# Patient Record
Sex: Female | Born: 1975 | Race: White | Hispanic: No | Marital: Married | Smoking: Never smoker
Health system: Southern US, Community
[De-identification: ages and names within clinical notes are randomized; demographics above are authoritative.]

---

## 2013-10-07 LAB — HM PAP SMEAR: HM PAP: NORMAL

## 2014-08-07 LAB — HM MAMMOGRAPHY: HM MAMMO: NORMAL

## 2015-06-08 ENCOUNTER — Ambulatory Visit (INDEPENDENT_AMBULATORY_CARE_PROVIDER_SITE_OTHER): Payer: 59 | Admitting: Osteopathic Medicine

## 2015-06-08 ENCOUNTER — Encounter: Payer: Self-pay | Admitting: Osteopathic Medicine

## 2015-06-08 VITALS — BP 118/79 | HR 71 | Ht 64.75 in | Wt 123.0 lb

## 2015-06-08 DIAGNOSIS — Z13220 Encounter for screening for lipoid disorders: Secondary | ICD-10-CM

## 2015-06-08 DIAGNOSIS — Z79899 Other long term (current) drug therapy: Secondary | ICD-10-CM

## 2015-06-08 DIAGNOSIS — M25531 Pain in right wrist: Secondary | ICD-10-CM

## 2015-06-08 DIAGNOSIS — J302 Other seasonal allergic rhinitis: Secondary | ICD-10-CM | POA: Diagnosis not present

## 2015-06-08 LAB — CBC WITH DIFFERENTIAL/PLATELET
BASOS ABS: 0 10*3/uL (ref 0.0–0.1)
BASOS PCT: 0 % (ref 0–1)
EOS ABS: 0.2 10*3/uL (ref 0.0–0.7)
Eosinophils Relative: 3 % (ref 0–5)
HCT: 41.2 % (ref 36.0–46.0)
HEMOGLOBIN: 14.2 g/dL (ref 12.0–15.0)
Lymphocytes Relative: 24 % (ref 12–46)
Lymphs Abs: 1.8 10*3/uL (ref 0.7–4.0)
MCH: 32.6 pg (ref 26.0–34.0)
MCHC: 34.5 g/dL (ref 30.0–36.0)
MCV: 94.5 fL (ref 78.0–100.0)
MONOS PCT: 7 % (ref 3–12)
MPV: 9.3 fL (ref 8.6–12.4)
Monocytes Absolute: 0.5 10*3/uL (ref 0.1–1.0)
NEUTROS ABS: 4.9 10*3/uL (ref 1.7–7.7)
NEUTROS PCT: 66 % (ref 43–77)
PLATELETS: 323 10*3/uL (ref 150–400)
RBC: 4.36 MIL/uL (ref 3.87–5.11)
RDW: 12.7 % (ref 11.5–15.5)
WBC: 7.4 10*3/uL (ref 4.0–10.5)

## 2015-06-08 LAB — LIPID PANEL
Cholesterol: 186 mg/dL (ref 125–200)
HDL: 120 mg/dL (ref >=46)
LDL CALC: 54 mg/dL (ref <130)
Total CHOL/HDL Ratio: 1.6 Ratio (ref <=5.0)
Triglycerides: 59 mg/dL (ref <150)
VLDL: 12 mg/dL (ref <30)

## 2015-06-08 LAB — COMPLETE METABOLIC PANEL WITH GFR
ALBUMIN: 4.1 g/dL (ref 3.6–5.1)
ALK PHOS: 26 U/L — AB (ref 33–115)
ALT: 19 U/L (ref 6–29)
AST: 23 U/L (ref 10–30)
BILIRUBIN TOTAL: 0.8 mg/dL (ref 0.2–1.2)
BUN: 13 mg/dL (ref 7–25)
CALCIUM: 9.5 mg/dL (ref 8.6–10.2)
CO2: 26 mmol/L (ref 20–31)
CREATININE: 0.76 mg/dL (ref 0.50–1.10)
Chloride: 100 mmol/L (ref 98–110)
GFR, Est Non African American: >89 mL/min (ref >=60)
Glucose, Bld: 83 mg/dL (ref 65–99)
Potassium: 4.1 mmol/L (ref 3.5–5.3)
Sodium: 138 mmol/L (ref 135–146)
TOTAL PROTEIN: 6.8 g/dL (ref 6.1–8.1)

## 2015-06-08 LAB — TSH: TSH: 1.779 u[IU]/mL (ref 0.350–4.500)

## 2015-06-08 MED ORDER — FLUTICASONE PROPIONATE 50 MCG/ACT NA SUSP: 2.0000 | Freq: Every day | NASAL | Status: AC

## 2015-06-08 MED ORDER — IBUPROFEN 400 MG PO TABS: 400.0000 mg | ORAL_TABLET | Freq: Four times a day (QID) | ORAL | Status: AC | PRN

## 2015-06-08 NOTE — Progress Notes (Signed)
HPI: Robin Patrick is a 39 y.o. female who presents to [facility]  today as a new patient for R wrist pain,on onset 2 - 3 weeks. Seem to be worse on medial wrist, has tried Ibuprofen,little relief. She thinks this started due to change in her workspace - now has two monitors, moves R wrist/arm more to use mouse and view monitors. Only ergonomic equipment is pad in front of keyboard, no ergonomic mouse or mousepad. No numbness/tingling or injury to the wrist.   Also reports seasonal allergies seem to be worsening lately, (+)sinus congestion but no pain, no fever/chills, no headache, no vision change.s    History reviewed. No pertinent past medical history. History reviewed. No pertinent past surgical history. Social History  Substance Use Topics  . Smoking status: Not on file  . Smokeless tobacco: Not on file  . Alcohol Use: Not on file   Medications: Current Outpatient Prescriptions  Medication Sig Dispense Refill  . clobetasol cream (TEMOVATE) 0.05 % Apply 1 application topically 2 (two) times daily.    . fluticasone (FLONASE) 50 MCG/ACT nasal spray Place 2 sprays into both nostrils daily. 16 g 6  . ibuprofen (ADVIL,MOTRIN) 400 MG tablet Take 1 tablet (400 mg total) by mouth every 6 (six) hours as needed for mild pain or moderate pain. 60 tablet 1  . ketoconazole (NIZORAL) 2 % cream Apply 1 application topically daily.    . LUTERA 0.1-20 MG-MCG tablet TK 1 T PO QD  4  . metroNIDAZOLE (METROGEL) 1 % gel Apply topically daily.    . minocycline (DYNACIN) 100 MG tablet Take 100 mg by mouth 2 (two) times daily.     No current facility-administered medications for this visit.   Allergies  Allergen Reactions  . Erythromycin Nausea Only     Review of Systems: CONSTITUTIONAL: Neg fever/chills, no unintentional weight changes HEAD/EYES/EARS/NOSE: No headache/vision change/heraing change, (+)seasonal allergies as per HPI CARDIAC: No chest  pain/pressure/palpitations, no orthopnea RESPIRATORY: No cough/shortness of breath/wheeze GASTROINTESTINAL: No nausea/vomiting/abdominal pain/blood in stool/diarrhea/constipation MUSCULOSKELETAL: R wrist pain as per HPI, no fall/injury GENITOURINARY: No incontinence, No abnormal genital bleeding/discharge SKIN: No rash/wounds/concerning lesions HEM/ONC: No easy bruising/bleeding, no abnormal lymph node    Exam:  BP 118/79 mmHg  Pulse 71  Ht 5' 4.75" (1.645 m)  Wt 123 lb (55.792 kg)  BMI 20.62 kg/m2  SpO2 100% Constitutional: VSS, see nurse notes. General Appearance: alert, well-developed, well-nourished, NAD Eyes: Normal lids and conjunctive, non-icteric sclera, PERRLA Ears, Nose, Mouth, Throat: Nasal mucosa pale, mild swollen, clear rhinorrhea Neck: No masses, trachea midline. No thyroid enlargement/tenderness/mass appreciated Respiratory: Normal respiratory effort. No dullness/hyper-resonance to percussion. Breath sounds normal, no wheeze/rhonchi/rales Cardiovascular: S1/S2 normal, no murmur/rub/gallop auscultated. No lower extremity edema Musculoskeletal: Tinel's and Phalen's bilaterally negative, positive mild tender to palpation right medial wrist Between ulna and carpal bones, no overlying skin changes, range of motion normal, muscle strength 5 out of 5  Neurological: No cranial nerve deficit on limited exam. Motor and sensation intact and symmetric Psychiatric: Normal judgment/insight. Normal mood and affect   No results found for this or any previous visit (from the past 24 hour(s)). No results found.   ASSESSMENT/PLAN: Please see individual assessment and plan sections for details and orders.  Summary and/or additional information as follows:  Right wrist pain Likely due to tendinitis: Recommend ice, judicious NSAID use as directed, can try a ergonomic mouse and mousepad to use at work, can try wrist splint as needed. If  she feels no improvement with conservative measures  can schedule with sports medicine for injection and consider physical therapy.  Seasonal allergies: Flonase recommended, prescription called in  Patient instructed to follow-up in clinic for complete physical exam and discussion of blood work results.

## 2015-06-08 NOTE — Patient Instructions (Signed)

## 2015-10-21 ENCOUNTER — Other Ambulatory Visit (HOSPITAL_COMMUNITY)
Admission: RE | Admit: 2015-10-21 | Discharge: 2015-10-21 | Disposition: A | Discharge status: Home / Self Care | Payer: 59 | Source: Ambulatory Visit | Attending: Osteopathic Medicine | Admitting: Osteopathic Medicine

## 2015-10-21 ENCOUNTER — Encounter: Payer: Self-pay | Admitting: Osteopathic Medicine

## 2015-10-21 ENCOUNTER — Ambulatory Visit (INDEPENDENT_AMBULATORY_CARE_PROVIDER_SITE_OTHER): Payer: 59 | Admitting: Osteopathic Medicine

## 2015-10-21 VITALS — BP 115/66 | HR 72 | Ht 64.0 in | Wt 123.0 lb

## 2015-10-21 DIAGNOSIS — Z124 Encounter for screening for malignant neoplasm of cervix: Secondary | ICD-10-CM | POA: Diagnosis not present

## 2015-10-21 DIAGNOSIS — Z0000 Encounter for general adult medical examination without abnormal findings: Secondary | ICD-10-CM | POA: Diagnosis not present

## 2015-10-21 DIAGNOSIS — Z1151 Encounter for screening for human papillomavirus (HPV): Secondary | ICD-10-CM | POA: Diagnosis not present

## 2015-10-21 DIAGNOSIS — Z23 Encounter for immunization: Secondary | ICD-10-CM | POA: Diagnosis not present

## 2015-10-21 DIAGNOSIS — Z1239 Encounter for other screening for malignant neoplasm of breast: Secondary | ICD-10-CM | POA: Insufficient documentation

## 2015-10-21 DIAGNOSIS — Z01411 Encounter for gynecological examination (general) (routine) with abnormal findings: Secondary | ICD-10-CM | POA: Diagnosis present

## 2015-10-21 NOTE — Progress Notes (Signed)
HPI: Robin Patrick is a 39 y.o. female who presents to [facility] today for chief complaint of:  Chief Complaint  Patient presents with  . Annual Exam   Preventive care reviewed as below.   Past medical, social and family history reviewed: No past medical history on file. No past surgical history on file. Social History  Substance Use Topics  . Smoking status: Never Smoker   . Smokeless tobacco: Not on file  . Alcohol Use: Not on file   Family History  Problem Relation Age of Onset  . Cancer Mother     Brease  . Cancer Sister 20    Breast  . Cancer Maternal Grandmother     Breast  . Cancer Maternal Grandfather     Lung    Current Outpatient Prescriptions  Medication Sig Dispense Refill  . clobetasol cream (TEMOVATE) 8.45 % Apply 1 application topically 2 (two) times daily.    . fluticasone (FLONASE) 50 MCG/ACT nasal spray Place 2 sprays into both nostrils daily. 16 g 6  . ibuprofen (ADVIL,MOTRIN) 400 MG tablet Take 1 tablet (400 mg total) by mouth every 6 (six) hours as needed for mild pain or moderate pain. 60 tablet 1  . ketoconazole (NIZORAL) 2 % cream Apply 1 application topically daily.    . LUTERA 0.1-20 MG-MCG tablet TK 1 T PO QD  4  . metroNIDAZOLE (METROGEL) 1 % gel Apply topically daily.    . minocycline (DYNACIN) 100 MG tablet Take 100 mg by mouth 2 (two) times daily.     No current facility-administered medications for this visit.   Allergies  Allergen Reactions  . Erythromycin Nausea Only      Review of Systems: CONSTITUTIONAL:  No fever, no chills, No  unintentional weight changes HEAD/EYES/EARS/NOSE/THROAT: No  headache, no vision change, no hearing change, No  sore throat, No  sinus pressure CARDIAC: No chest pain, No  pressure, No palpitations, No  orthopnea RESPIRATORY: No  cough, No  shortness of breath/wheeze GASTROINTESTINAL: No  nausea, No  vomiting, No  abdominal pain, No  blood in stool, No  diarrhea, No   constipation  MUSCULOSKELETAL: No  myalgia/arthralgia GENITOURINARY: No  incontinence, No  abnormal genital bleeding/discharge SKIN: No  rash/wounds/concerning lesions HEM/ONC: No  easy bruising/bleeding, No  abnormal lymph node ENDOCRINE: No  polyuria/polydipsia/polyphagia, No  heat/cold intolerance  NEUROLOGIC: No  weakness, No  dizziness, No  slurred speech PSYCHIATRIC: No  concerns with depression, No  concerns with anxiety, No sleep problems     Exam:  BP 115/66 mmHg  Pulse 72  Ht 5' 4" (1.626 m)  Wt 123 lb (55.792 kg)  BMI 21.10 kg/m2 Constitutional: VS see above. General Appearance: alert, well-developed, well-nourished, NAD Eyes: Normal lids and conjunctive, non-icteric sclera, PERRLA Ears, Nose, Mouth, Throat: MMM, Normal external inspection ears/nares/mouth/lips/gums,  Neck: No masses, trachea midline. No thyroid enlargement/tenderness/mass appreciated. No lymphadenopathy Respiratory: Normal respiratory effort. no wheeze, no rhonchi, no rales Cardiovascular: S1/S2 normal, no murmur, no rub/gallop auscultated. RRR. No lower extremity edema. Gastrointestinal: Nontender, no masses. No hepatomegaly, no splenomegaly. No hernia appreciated. Bowel sounds normal. Rectal exam deferred.  Musculoskeletal: Gait normal. No clubbing/cyanosis of digits.  Neurological: No cranial nerve deficit on limited exam. Motor and sensation intact and symmetric Skin: warm, dry, intact. No rash/ulcer. No concerning nevi or subq nodules on limited exam.   Psychiatric: Normal judgment/insight. Normal mood and affect. Oriented x3.  GYN: No lesions/ulcers to external genitalia, normal urethra, normal vaginal  mucosa, physiologic discharge, cervix normal without lesions, uterus not enlarged or tender, adnexa no masses and nontender BREAST: No rashes/skin changes, normal fibrous breast tissue, no masses or tenderness, normal nipple without discharge, normal axilla     ASSESSMENT/PLAN: (+)FH Breast cancer,  reviewed diagnostic mammo results 07/2014 recommend screening mammo f/u one year. Flu vax today.  Need to confirm last tetanus vaccine.  Declines STI testing - monogamous with husband.  Not sure if last Pap was HPV cotest, records do not show HPV cotest, will get Pap today.   Annual physical exam  Screening for breast cancer - Plan: MM DIGITAL SCREENING BILATERAL  Screening for cervical cancer - Plan: Cytology - PAP  Need for prophylactic vaccination and inoculation against influenza - Plan: Flu Vaccine QUAD 36+ mos IM   FEMALE PREVENTIVE CARE  ANNUAL SCREENING/COUNSELING Tobacco - Never  Alcohol - social drinker Diet/Exercise - HEALTHY HABITS DISCUSSED TO DECREASE CV RISK - exercise 5 days per week  Sexual Health - Yes with female. 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 INTERESTED IN STI TESTING - no Depression - PQH2 Negative Domestic violence concerns - no HTN SCREENING - SEE VITALS Vaccination status - SEE BELOW  INFECTIOUS DISEASE SCREENING HIV - all adults 15-65 - does not need GC/CT - sexually active - does not need HepC - born 57-1964 - does not need TB - if risk/required by employer - does not need  DISEASE SCREENING Lipid - (Low risk screen M35/F45; High risk screen M25/F35 if HTN, Tob, FH CHD M<55/F<65) - does not need DM2 (45+ or Risk = FH 1st deg DM, Hx GDM, overweight/sedentary, high-risk ethnicity, HTN) - does not need Osteoporosis - age 52+ or one sooner if risk - does not need  CANCER SCREENING Cervical - Pap q3 yr age 31+, Pap + HPV q5y age 29+ - PAP - needs Breast - Mammo age 11+ (C) and biennial age 50-75 (A) - MAMMO - needs. Has had 2 mammograms due to family history. Opted out of genetic counseling. Mom BRCA testing was negative.  Lung - annual low dose CT Chest age 63-75 w/ 30+ PY, current/quit past 15 years - CT - does not need Colon - age 44+ or 38 years of age prior to [place] Dx - GI REFERRAL/ COLOGUARD - does not need  ADULT  VACCINATION Influenza - annual - was given Td booster every 10 years - already has HPV - age <64yo - was not indicated Zoster - age 88+ - was not indicated Pneumonia - age 58+ sooner if risk (DM, smoker, other) - was not indicated  OTHER Fall - exercise and Vit D age 54+ - does not need Consider ASA - age 42-59 - does not need   Return in about 1 year (around 10/20/2016), or sooner if needed, for ANNUAL PHYSICAL.

## 2015-10-22 LAB — CYTOLOGY - PAP

## 2015-11-09 ENCOUNTER — Other Ambulatory Visit: Payer: Self-pay | Admitting: Osteopathic Medicine

## 2015-11-12 ENCOUNTER — Other Ambulatory Visit: Payer: Self-pay

## 2015-11-12 ENCOUNTER — Telehealth: Payer: Self-pay

## 2015-11-12 MED ORDER — CLOTRIMAZOLE-BETAMETHASONE 1-0.05 % EX CREA: 1.0000 "application " | TOPICAL_CREAM | Freq: Two times a day (BID) | CUTANEOUS | Status: AC

## 2015-11-12 MED ORDER — LUTERA 0.1-20 MG-MCG PO TABS: 1.0000 | ORAL_TABLET | Freq: Every day | ORAL | Status: DC

## 2015-11-12 MED ORDER — VALACYCLOVIR HCL 1 G PO TABS: 1000.0000 mg | ORAL_TABLET | Freq: Two times a day (BID) | ORAL | Status: AC | PRN

## 2015-11-12 MED ORDER — CLOBETASOL PROPIONATE 0.05 % EX CREA: 1.0000 "application " | TOPICAL_CREAM | Freq: Two times a day (BID) | CUTANEOUS | Status: DC

## 2015-11-12 NOTE — Telephone Encounter (Signed)
Patient was unable to take at the moment. She will call back when she can talk.

## 2015-11-12 NOTE — Telephone Encounter (Signed)
Patient needs a refill on BCP, clobetasol cream and valtrex 1 gram 1 every 12 hours. She states the pharmacy sent a refill request but it was denied. Historical provider.

## 2015-11-12 NOTE — Telephone Encounter (Signed)
Okay, please call patient, let her know I sent these. 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 We did not have this medication on her list but it was in her record from I believe [facility]. Just need to clarify so we can have accurate chart and problem list, I did not issue any other refills on this medication, she needs to let us know why she is taking it so I can confirm she is on an appropriate dose. Thanks!

## 2015-11-30 ENCOUNTER — Telehealth: Payer: Self-pay | Admitting: Osteopathic Medicine

## 2015-11-30 DIAGNOSIS — M25531 Pain in right wrist: Secondary | ICD-10-CM

## 2015-11-30 NOTE — Telephone Encounter (Signed)
Pt called. She wants be referred to a hand specialist.  Thank you.

## 2015-12-01 NOTE — Telephone Encounter (Signed)
Please see note below. Michalle Rademaker,CMA  

## 2015-12-01 NOTE — Telephone Encounter (Signed)
SPOKE TO PATIENT TO GAVE HER INFORMATION AS NOTED BELOW. SHE WAS SEEN IN AUGUST FOR THE ISSUE AND  SHE STATED THAT HER PROBLEM IS NOT GETTING ANY BETTER AND SHE WANTS TO PROCEED TO THE NEXT STEP WHICH IS WITH A HAND SPECIALIST.PATIENT  Robin Patrick,CMA

## 2015-12-01 NOTE — Telephone Encounter (Signed)
I haven't discussed this issue with her since 05/2015. I would recommend seeing one of our sports med docs prior to referring to an orthopedic surgeon.

## 2015-12-02 NOTE — Telephone Encounter (Signed)
Okay, referred her to hand specialist in [place], please let us know if she doesn't hear back about an appointment with this surgeon.

## 2015-12-02 NOTE — Telephone Encounter (Signed)
Spoke to patient gave her information as noted below. Rhonda Cunningham,CMA  

## 2015-12-16 ENCOUNTER — Encounter: Payer: Self-pay | Admitting: Osteopathic Medicine

## 2015-12-16 ENCOUNTER — Ambulatory Visit (INDEPENDENT_AMBULATORY_CARE_PROVIDER_SITE_OTHER): Payer: 59

## 2015-12-16 ENCOUNTER — Ambulatory Visit (INDEPENDENT_AMBULATORY_CARE_PROVIDER_SITE_OTHER): Payer: 59 | Admitting: Osteopathic Medicine

## 2015-12-16 ENCOUNTER — Other Ambulatory Visit: Payer: Self-pay | Admitting: Osteopathic Medicine

## 2015-12-16 VITALS — BP 123/73 | HR 83 | Ht 64.0 in | Wt 125.0 lb

## 2015-12-16 DIAGNOSIS — M25541 Pain in joints of right hand: Secondary | ICD-10-CM | POA: Diagnosis not present

## 2015-12-16 DIAGNOSIS — M79644 Pain in right finger(s): Secondary | ICD-10-CM | POA: Diagnosis not present

## 2015-12-16 DIAGNOSIS — B354 Tinea corporis: Secondary | ICD-10-CM

## 2015-12-16 DIAGNOSIS — M25531 Pain in right wrist: Secondary | ICD-10-CM

## 2015-12-16 DIAGNOSIS — R21 Rash and other nonspecific skin eruption: Secondary | ICD-10-CM | POA: Diagnosis not present

## 2015-12-16 MED ORDER — CLOTRIMAZOLE 1 % EX OINT: 1.0000 "application " | TOPICAL_OINTMENT | Freq: Two times a day (BID) | CUTANEOUS | Status: DC

## 2015-12-16 NOTE — Patient Instructions (Addendum)
For rash, this looks consistent with possible fungal infection, we'll treat for that, don't use any topical steroid such as hydrocortisone. For itching can use baby powder or can use systemic antihistamine such as Claritin or Benadryl, if those aren't helping please let me know and I can call in something a bit stronger for itching. If rash is getting worse or is still present after 2-3 weeks, please come in for further evaluation or we can also refer to dermatology  We'll get x-rays of wrist and hand today, please make an appointment to see Dr. Denyse Amass on your way out.

## 2015-12-16 NOTE — Progress Notes (Signed)
HPI: Robin Patrick is a 40 y.o. female who presents to [facility] today for chief complaint of:  Chief Complaint  Patient presents with  . Rash    LEFT UNDER ARM  . Wrist Pain    RIGHT    RASH . Location: L Underarm  . Quality: started as a few thin lines, went to itching, started small getting worse  . Duration: 3 weeks . Timing: constant . Context: started after use of new razor . Modifying factors: stopped deodorant and shaving, tea tree oil, coconut oil, hydrocortisone cream which helps but the rash is still there . Assoc signs/symptoms: nondraining, no enlarged LN, no fever/chlils  WRIST PAIN . Location: R wrist/hand . Quality: soreness . Severity: mild - severe depend on day . Duration: >6 mos . Context: worse with movement, worse at work . Modifying factors: tried ergonomics for at work, wrist a bit better but now thumb is bothering her  Other: notes and images needed from [facility] according to the patient.    Past medical, social and family history reviewed: No past medical history on file. No past surgical history on file. Social History  Substance Use Topics  . Smoking status: Never Smoker   . Smokeless tobacco: Not on file  . Alcohol Use: Not on file   Family History  Problem Relation Age of Onset  . Cancer Mother     Brease  . Cancer Sister 67    Breast  . Cancer Maternal Grandmother     Breast  . Cancer Maternal Grandfather     Lung    Current Outpatient Prescriptions  Medication Sig Dispense Refill  . clobetasol cream (TEMOVATE) 0.05 % Apply 1 application topically 2 (two) times daily. 30 g 0  . clotrimazole-betamethasone (LOTRISONE) cream Apply 1 application topically 2 (two) times daily. 45 g 3  . fluticasone (FLONASE) 50 MCG/ACT nasal spray Place 2 sprays into both nostrils daily. 16 g 6  . ibuprofen (ADVIL,MOTRIN) 400 MG tablet Take 1 tablet (400 mg total) by mouth every 6 (six) hours as needed for mild  pain or moderate pain. 60 tablet 1  . ketoconazole (NIZORAL) 2 % cream Apply 1 application topically daily.    Aldean Ast 0.1-20 MG-MCG tablet Take 1 tablet by mouth daily. 1 Package 12  . metroNIDAZOLE (METROGEL) 1 % gel Apply topically daily.    . minocycline (DYNACIN) 100 MG tablet Take 100 mg by mouth 2 (two) times daily.    . valACYclovir (VALTREX) 1000 MG tablet Take 1 tablet (1,000 mg total) by mouth 2 (two) times daily as needed. 30 tablet 0   No current facility-administered medications for this visit.   Allergies  Allergen Reactions  . Erythromycin Nausea Only      Review of Systems: CONSTITUTIONAL:  No  fever, no chills MUSCULOSKELETAL: (+) R wrist/hand myalgia/arthralgia SKIN: (+) rash/wounds/concerning lesions HEM/ONC: No  easy bruising/bleeding, No  abnormal lymph node  Exam:  BP 123/73 mmHg  Pulse 83  Ht  (1.626 m)  Wt 125 lb (56.7 kg)  BMI 21.45 kg/m2 Constitutional: VS see above. General Appearance: alert, well-developed, well-nourished, NAD Musculoskeletal: Gait normal. No clubbing/cyanosis of digits. Normal ROM R wrist and thumb, no bony abnormalities palpable, no crepitus, mild tender R 1st MCP, neg Tinel's Skin: warm, dry, intact. (+) rash, multiple linear areas of erythema in L axilla, nondraining, nontender, no enlarged axillary LN, otherwise No rash/ulcer. No concerning nevi or subq nodules on limited exam.  ASSESSMENT/PLAN: Rash c/w possible tinea, treat w/ antifungal, avoid shaving and deodorant for now to avoid irritation. If signs of bacterial infection such as pain/drainage or worsening rash RTC for further eval.  For wrist pain/thumb pain, pt opting to see one of our sports med docs instead of ortho, will get XR today, still I suspect likely tendinitis but she may certainly benefit from injection/brace/PT. Pt to make appt to come back and see Dr Denyse Amass.    Right wrist pain - Plan: DG Wrist Complete Right, DG Hand Complete Right  Thumb pain,  right - Plan: DG Hand Complete Right  Rash and nonspecific skin eruption - Plan: Clotrimazole 1 % OINT  Tinea corporis - Plan: Clotrimazole 1 % OINT   Return if symptoms worsen or fail to improve.

## 2015-12-17 ENCOUNTER — Encounter: Payer: Self-pay | Admitting: Family Medicine

## 2015-12-17 ENCOUNTER — Ambulatory Visit (INDEPENDENT_AMBULATORY_CARE_PROVIDER_SITE_OTHER): Payer: 59 | Admitting: Family Medicine

## 2015-12-17 VITALS — BP 130/76 | HR 69 | Ht 64.0 in | Wt 126.0 lb

## 2015-12-17 DIAGNOSIS — M778 Other enthesopathies, not elsewhere classified: Secondary | ICD-10-CM | POA: Diagnosis not present

## 2015-12-17 MED ORDER — DICLOFENAC SODIUM 2 % TD SOLN: 2.0000 | Freq: Two times a day (BID) | TRANSDERMAL | Status: AC

## 2015-12-17 NOTE — Progress Notes (Signed)
   Subjective:    I'm seeing this patient as a consultation for:  Dr. Lyn Hollingshead  CC: Right wrist and thumb pain  HPI: Patient has an 8 month history of ulnar sided wrist pain. She has tried bracing and different mouse at work which has helped a bit. She's tried ibuprofen which hasn't. We'll send a note mouth are those become painful. She notes radial sided MCP thumb pain and swelling that has resolved that. She denies any radiating pain weakness or numbness. She denies any specific injury locking or catching at rest.  Past medical history, Surgical history, Family history not pertinant except as noted below, Social history, Allergies, and medications have been entered into the medical record, reviewed, and no changes needed.   Review of Systems: No headache, visual changes, nausea, vomiting, diarrhea, constipation, dizziness, abdominal pain, skin rash, fevers, chills, night sweats, weight loss, swollen lymph nodes, body aches, joint swelling, muscle aches, chest pain, shortness of breath, mood changes, visual or auditory hallucinations.   Objective:    Filed Vitals:   12/17/15 0839  BP: 130/76  Pulse: 69   General: Well Developed, well nourished, and in no acute distress.  Neuro/Psych: Alert and oriented x3, extra-ocular muscles intact, able to move all 4 extremities, sensation grossly intact. Skin: Warm and dry, no rashes noted.  Respiratory: Not using accessory muscles, speaking in full sentences, trachea midline.  Cardiovascular: Pulses palpable, no extremity edema. Abdomen: Does not appear distended. MSK: Right wrist is normal-appearing with no swelling or ecchymosis. Normal motion. Pain with resisted extension and ulnar deviation. Hand is normal appearing and nontender with normal motion. No triggering.  Pulses capillary refill sensation and strength intact.  No results found for this or any previous visit (from the past 24 hour(s)). Dg Wrist Complete Right  12/16/2015  CLINICAL  DATA:  Right wrist and hand pain. EXAM: RIGHT WRIST - COMPLETE 3+ VIEW COMPARISON:  No prior. FINDINGS: No acute bony or joint abnormality identified. Faint noted along the distal radius is most likely residual epiphyseal plate. No evidence of displaced fracture or dislocation. IMPRESSION: No acute abnormality. Electronically Signed   By: Maisie Fus  Register   On: 12/16/2015 09:24   Dg Hand Complete Right  12/16/2015  CLINICAL DATA:  Right wrist and hand pain.  Initial evaluation. EXAM: RIGHT HAND - COMPLETE 3+ VIEW COMPARISON:  No prior. FINDINGS: No acute bony or joint abnormality identified. No evidence of fracture dislocation. IMPRESSION: No acute abnormality. Electronically Signed   By: Maisie Fus  Register   On: 12/16/2015 09:25    Impression and Recommendations:   This case required medical decision making of moderate complexity.

## 2015-12-17 NOTE — Assessment & Plan Note (Signed)
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 Plan for diclofenac solution and referral to hand therapy. Recheck in one month. Thumb pain is likely overuse related. Same treatment.

## 2015-12-17 NOTE — Patient Instructions (Signed)
Thank you for coming in today. Attend hand therapy.  Return in 1 month.  Return sooner if needed.  Apply Pennsaid 2% solution 4x daily.   Tendinitis Tendinitis is swelling and inflammation of the tendons. Tendons are band-like tissues that connect muscle to bone. Tendinitis commonly occurs in the:   Shoulders (rotator cuff).  Heels (Achilles tendon).  Elbows (triceps tendon). CAUSES Tendinitis is usually caused by overusing the tendon, muscles, and joints involved. When the tissue surrounding a tendon (synovium) becomes inflamed, it is called tenosynovitis. Tendinitis commonly develops in people whose jobs require repetitive motions. SYMPTOMS  Pain.  Tenderness.  Mild swelling. DIAGNOSIS Tendinitis is usually diagnosed by physical exam. Your health care provider may also order X-rays or other imaging tests. TREATMENT Your health care provider may recommend certain medicines or exercises for your treatment. HOME CARE INSTRUCTIONS   Use a sling or splint for as long as directed by your health care provider until the pain decreases.  Put ice on the injured area.  Put ice in a plastic bag.  Place a towel between your skin and the bag.  Leave the ice on for 15-20 minutes, 3-4 times a day, or as directed by your health care provider.  Avoid using the limb while the tendon is painful. Perform gentle range of motion exercises only as directed by your health care provider. Stop exercises if pain or discomfort increase, unless directed otherwise by your health care provider.  Only take over-the-counter or prescription medicines for pain, discomfort, or fever as directed by your health care provider. SEEK MEDICAL CARE IF:   Your pain and swelling increase.  You develop new, unexplained symptoms, especially increased numbness in the hands. MAKE SURE YOU:   Understand these instructions.  Will watch your condition.  Will get help right away if you are not doing well or get  worse.   This information is not intended to replace advice given to you by your health care provider. Make sure you discuss any questions you have with your health care provider.   Document Released: 10/07/2000 Document Revised: 10/31/2014 Document Reviewed: 12/27/2010 Elsevier Interactive Patient Education [employer]

## 2015-12-25 ENCOUNTER — Encounter: Payer: Self-pay | Admitting: Osteopathic Medicine

## 2015-12-25 MED ORDER — BUTENAFINE HCL 1 % EX CREA: TOPICAL_CREAM | CUTANEOUS | Status: DC

## 2016-11-01 ENCOUNTER — Encounter: Payer: Self-pay | Admitting: Osteopathic Medicine

## 2016-11-01 ENCOUNTER — Ambulatory Visit (INDEPENDENT_AMBULATORY_CARE_PROVIDER_SITE_OTHER): Payer: 59 | Admitting: Osteopathic Medicine

## 2016-11-01 VITALS — BP 126/84 | HR 84 | Ht 64.0 in | Wt 122.0 lb

## 2016-11-01 DIAGNOSIS — M25561 Pain in right knee: Secondary | ICD-10-CM

## 2016-11-01 DIAGNOSIS — Z0000 Encounter for general adult medical examination without abnormal findings: Secondary | ICD-10-CM

## 2016-11-01 DIAGNOSIS — Z0001 Encounter for general adult medical examination with abnormal findings: Secondary | ICD-10-CM

## 2016-11-01 LAB — LIPID PANEL
CHOLESTEROL: 178 mg/dL (ref <200)
HDL: 123 mg/dL (ref >50)
LDL CALC: 41 mg/dL (ref <100)
TRIGLYCERIDES: 72 mg/dL (ref <150)
Total CHOL/HDL Ratio: 1.4 Ratio (ref <5.0)
VLDL: 14 mg/dL (ref <30)

## 2016-11-01 LAB — COMPLETE METABOLIC PANEL WITH GFR
ALT: 21 U/L (ref 6–29)
AST: 24 U/L (ref 10–30)
Albumin: 3.8 g/dL (ref 3.6–5.1)
Alkaline Phosphatase: 30 U/L — ABNORMAL LOW (ref 33–115)
BILIRUBIN TOTAL: 0.8 mg/dL (ref 0.2–1.2)
BUN: 16 mg/dL (ref 7–25)
CALCIUM: 8.5 mg/dL — AB (ref 8.6–10.2)
CHLORIDE: 103 mmol/L (ref 98–110)
CO2: 25 mmol/L (ref 20–31)
CREATININE: 0.85 mg/dL (ref 0.50–1.10)
GFR, EST NON AFRICAN AMERICAN: 87 mL/min (ref >=60)
GFR, Est African American: >89 mL/min (ref >=60)
Glucose, Bld: 90 mg/dL (ref 65–99)
Potassium: 3.8 mmol/L (ref 3.5–5.3)
Sodium: 137 mmol/L (ref 135–146)
Total Protein: 6.5 g/dL (ref 6.1–8.1)

## 2016-11-01 MED ORDER — CLOBETASOL PROPIONATE 0.05 % EX SOLN: 1.0000 "application " | Freq: Two times a day (BID) | CUTANEOUS | 12 refills | Status: AC

## 2016-11-01 MED ORDER — LUTERA 0.1-20 MG-MCG PO TABS: 1.0000 | ORAL_TABLET | Freq: Every day | ORAL | 12 refills | Status: AC

## 2016-11-01 MED ORDER — KETOCONAZOLE 2 % EX SHAM: 1.0000 "application " | MEDICATED_SHAMPOO | CUTANEOUS | 0 refills | Status: AC

## 2016-11-01 MED ORDER — MINOCYCLINE HCL 100 MG PO TABS: 100.0000 mg | ORAL_TABLET | Freq: Two times a day (BID) | ORAL | 3 refills | Status: DC

## 2016-11-01 NOTE — Progress Notes (Signed)
HPI: Robin Patrick is a 41 y.o. female  who presents to [facility] today, 11/01/16,  for chief complaint of:  Chief Complaint  Patient presents with  . Annual Exam    Annual physical exam: See below for review of preventive care.  New/acute problem: Right knee pain, patient states injury sustained about a week ago on ski trip due to fall, has been getting steadily better but feels like she is having difficulty straightening her knee, no significant pain with ambulation, no feeling of joint laxity, no subsequent fall.    Past medical, surgical, social and family history reviewed: Patient Active Problem List   Diagnosis Date Noted  . Right wrist tendonitis 12/17/2015  . Screening for breast cancer 10/21/2015   No past surgical history on file. Social History  Substance Use Topics  . Smoking status: Never Smoker  . Smokeless tobacco: Not on file  . Alcohol use Not on file   Family History  Problem Relation Age of Onset  . Cancer Mother     Brease  . Cancer Sister 33    Breast  . Cancer Maternal Grandmother     Breast  . Cancer Maternal Grandfather     Lung     Current medication list and allergy/intolerance information reviewed:   Current Outpatient Prescriptions  Medication Sig Dispense Refill  . Butenafine HCl 1 % cream Apply topically 2 (two) times daily 24 g 0  . clobetasol cream (TEMOVATE) 0.05 % Apply 1 application topically 2 (two) times daily. 30 g 0  . clotrimazole-betamethasone (LOTRISONE) cream Apply 1 application topically 2 (two) times daily. 45 g 3  . Diclofenac Sodium 2 % SOLN Place 2 sprays onto the skin 2 (two) times daily. 1 Bottle 11  . fluticasone (FLONASE) 50 MCG/ACT nasal spray Place 2 sprays into both nostrils daily. 16 g 6  . ibuprofen (ADVIL,MOTRIN) 400 MG tablet Take 1 tablet (400 mg total) by mouth every 6 (six) hours as needed for mild pain or moderate pain. 60 tablet 1  . ketoconazole (NIZORAL) 2 % cream Apply  1 application topically daily. shampoo    . LUTERA 0.1-20 MG-MCG tablet Take 1 tablet by mouth daily. 1 Package 12  . metroNIDAZOLE (METROGEL) 1 % gel Apply topically daily.    . minocycline (DYNACIN) 100 MG tablet Take 100 mg by mouth 2 (two) times daily.    . valACYclovir (VALTREX) 1000 MG tablet Take 1 tablet (1,000 mg total) by mouth 2 (two) times daily as needed. 30 tablet 0   No current facility-administered medications for this visit.    Allergies  Allergen Reactions  . Erythromycin Nausea Only      Review of Systems:  Constitutional:  No  fever, no chills, No recent illness, No unintentional weight changes. No significant fatigue.   HEENT: No  headache, no vision change, no hearing change, No sore throat, No  sinus pressure  Cardiac: No  chest pain, No  pressure, No palpitations,  Respiratory:  No  shortness of breath. No  Cough  Gastrointestinal: No  abdominal pain, No  nausea, No  vomiting,   Musculoskeletal: +new myalgia/arthralgia  Skin: No  Rash  Hem/Onc: No  easy bruising/bleeding, No  abnormal lymph node  Neurologic: No  weakness, No  dizziness,   Psychiatric: No  concerns with depression, No  concerns with anxiety, No sleep problems, No mood problems  Exam:  BP 126/84   Pulse 84   Ht 5\' 4"  (1.626 m)  Wt 122 lb (55.3 kg)   BMI 20.94 kg/m   Constitutional: VS see above. General Appearance: alert, well-developed, well-nourished, NAD  Eyes: Normal lids and conjunctive, non-icteric sclera  Ears, Nose, Mouth, Throat: MMM, Normal external inspection ears/nares/mouth/lips/gums. TM normal bilaterally. Pharynx/tonsils no erythema, no exudate. Nasal mucosa normal.   Neck: No masses, trachea midline. No thyroid enlargement. No tenderness/mass appreciated. No lymphadenopathy  Respiratory: Normal respiratory effort. no wheeze, no rhonchi, no rales  Cardiovascular: S1/S2 normal, no murmur, no rub/gallop auscultated. RRR. No lower extremity edema.    Gastrointestinal: Nontender, no masses. No hepatomegaly, no splenomegaly. No hernia appreciated. Bowel sounds normal. Rectal exam deferred.   Musculoskeletal: Gait normal. No clubbing/cyanosis of digits. Right knee: Negative anterior/posterior drawer, negative varus/by this test, negative McMurray's, some laxity to patella evident on right as compared to left   Neurological: Normal balance/coordination. No tremor. No cranial nerve deficit on limited exam. Motor and sensation intact and symmetric. Cerebellar reflexes intact.   Skin: warm, dry, intact. No rash/ulcer. No concerning nevi or subq nodules on limited exam.    Psychiatric: Normal judgment/insight. Normal mood and affect. Oriented x3.    Results for orders placed or performed in visit on 11/01/16 (from the past 72 hour(s))  COMPLETE METABOLIC PANEL WITH GFR     Status: Abnormal   Collection Time: 11/01/16  9:23 AM  Result Value Ref Range   Sodium 137 135 - 146 mmol/L   Potassium 3.8 3.5 - 5.3 mmol/L   Chloride 103 98 - 110 mmol/L   CO2 25 20 - 31 mmol/L   Glucose, Bld 90 65 - 99 mg/dL   BUN 16 7 - 25 mg/dL   Creat 1.61 0.96 - 0.45 mg/dL   Total Bilirubin 0.8 0.2 - 1.2 mg/dL   Alkaline Phosphatase 30 (L) 33 - 115 U/L   AST 24 10 - 30 U/L   ALT 21 6 - 29 U/L   Total Protein 6.5 6.1 - 8.1 g/dL   Albumin 3.8 3.6 - 5.1 g/dL   Calcium 8.5 (L) 8.6 - 10.2 mg/dL   GFR, Est African American >89 >=60 mL/min   GFR, Est Non African American 87 >=60 mL/min  Lipid panel     Status: None   Collection Time: 11/01/16  9:23 AM  Result Value Ref Range   Cholesterol 178 <200 mg/dL   Triglycerides 72 <409 mg/dL   HDL 811 >91 mg/dL    Comment: Result repeated and verified. Result confirmed by automatic dilution.    Total CHOL/HDL Ratio 1.4 <5.0 Ratio   VLDL 14 <30 mg/dL   LDL Cholesterol 41 <478 mg/dL      ASSESSMENT/PLAN:   Annual physical exam - Plan: COMPLETE METABOLIC PANEL WITH GFR, Lipid panel  Acute pain of right knee -  Some patellar subluxation, rehabilitation exercises given, advised schedule follow-up with sports medicine if pain is not resolved by end of the week      FEMALE PREVENTIVE CARE Updated 11/01/16   ANNUAL SCREENING/COUNSELING  Diet/Exercise - HEALTHY HABITS DISCUSSED TO DECREASE CV RISK History  Smoking Status  . Never Smoker  Smokeless Tobacco  . Not on file   History  Alcohol use Not on file   No flowsheet data found.  Domestic violence concerns - no  HTN SCREENING - SEE VITALS  SEXUAL HEALTH  Sexually active in the past year - Yes with female.  Need/want STI testing today? - no  Concerns about libido or pain with sex? - no  Plans for  pregnancy? - on birth control   INFECTIOUS DISEASE SCREENING  HIV - does not need - Declined  GC/CT - does not need - declined  HepC - DOB [birth year] - does not need  TB - does not need  DISEASE SCREENING  Lipid - needs  DM2 - needs  Osteoporosis - women age 35+ - does not need   CANCER SCREENING  Cervical - does not need  Breast - plan to start at age 40, positive family history sister but sister tested negative for genetic mutation  Lung - does not need  Colon - does not need  ADULT VACCINATION  Influenza - annual vaccine recommended  Td - booster every 10 years   Zoster - option at 6150, yes at 60+   PCV13 - was not indicated  PPSV23 - was not indicated Immunization History  Administered Date(s) Administered  . Influenza,inj,Quad PF,36+ Mos 10/21/2015    OTHER  Fall - exercise and Vit D age 35+ - does not need  Consider ASA - age 40-59 - does not need    Visit summary with medication list and pertinent instructions was printed for patient to review. All questions at time of visit were answered - patient instructed to contact office with any additional concerns. ER/RTC precautions were reviewed with the patient. Follow-up plan: Return in about 1 year (around 11/01/2017) for [facility], sooner if  needed for sports med to evaluate the knee.

## 2016-11-07 ENCOUNTER — Encounter: Payer: Self-pay | Admitting: Family Medicine

## 2016-11-07 ENCOUNTER — Ambulatory Visit (INDEPENDENT_AMBULATORY_CARE_PROVIDER_SITE_OTHER): Payer: 59 | Admitting: Family Medicine

## 2016-11-07 DIAGNOSIS — M25561 Pain in right knee: Secondary | ICD-10-CM

## 2016-11-07 NOTE — Progress Notes (Signed)
   Subjective:    I'm seeing this patient as a consultation for:  Sunnie NielsenNatalie Alexander, DO   CC: Right knee injury  HPI: Patient injured her right knee skiing 2 weeks ago. She fell standing and twisted her knee. She does not remember a pop and denies any knee swelling. She has been resting her knee and using topical Pennsaid. She notes this helps a little. She notes that she cannot fully extend her knee nor fully flex it. She feels popping occasionally with knee extension. She has some pain along the medial aspect of the knee as well. She denies any fevers or chills nausea vomiting or diarrhea. She typically exercises daily doing plyometrics squats and lunges and running. She notes that since her injury she has been unable to exercise. This is very bothersome and interferes with her quality of life.   Past medical history, Surgical history, Family history not pertinant except as noted below, Social history, Allergies, and medications have been entered into the medical record, reviewed, and no changes needed.   Review of Systems: No headache, visual changes, nausea, vomiting, diarrhea, constipation, dizziness, abdominal pain, skin rash, fevers, chills, night sweats, weight loss, swollen lymph nodes, body aches, joint swelling, muscle aches, chest pain, shortness of breath, mood changes, visual or auditory hallucinations.   Objective:    Vitals:   11/07/16 0858  BP: 123/71  Pulse: 72   General: Well Developed, well nourished, and in no acute distress.  Neuro/Psych: Alert and oriented x3, extra-ocular muscles intact, able to move all 4 extremities, sensation grossly intact. Skin: Warm and dry, no rashes noted.  Respiratory: Not using accessory muscles, speaking in full sentences, trachea midline.  Cardiovascular: Pulses palpable, no extremity edema. Abdomen: Does not appear distended. MSK: Right Knee is unremarkable with no significant effusion. Mild quadricep atrophy right compared to  left. Range of motion: 5-120 compared to 0-120 left. Tender to palpation medial joint line. No laxity or tenderness with anterior posterior drawer testing. Laxity but pain present with MCL stress test. No laxity or pain with LCL stress. Mildly positive medial McMurray's test negative lateral. Intact flexion and extension strength. Normal gait.   No results found.  Impression and Recommendations:    Assessment and Plan: 41 y.o. female with Right knee pain concerning for injury of MCL or medial meniscus. We discussed options. Plan for trial of home therapy. Additionally continue topical Pennsaid. If not better will proceed with MRI in the near future.   discussed warning signs or symptoms. Please see discharge instructions. Patient expresses understanding.

## 2016-11-07 NOTE — Patient Instructions (Signed)
Thank you for coming in today. Work on knee exercises.  Cycling Eccentric Quad knee extension.  High seat position single leg squats.   Let me know if not better in 1-2 weeks and we will do a MRI.     Meniscus Tear Introduction A meniscus tear is a knee injury in which a piece of the meniscus is torn. The meniscus is a thick, rubbery, wedge-shaped cartilage in the knee. Two menisci are located in each knee. They sit between the upper bone (femur) and lower bone (tibia) that make up the knee joint. Each meniscus acts as a shock absorber for the knee. A torn meniscus is one of the most common types of knee injuries. This injury can range from mild to severe. Surgery may be needed for a severe tear. What are the causes? This injury may be caused by any squatting, twisting, or pivoting movement. Sports-related injuries are the most common cause. These often occur from:  Running and stopping suddenly.  Changing direction.  Being tackled or knocked off your feet. As people get older, their meniscus gets thinner and weaker. In these people, tears can happen more easily, such as from climbing stairs. What increases the risk? This injury is more likely to happen to:  People who play contact sports.  Males.  People who are 51?40 years of age. What are the signs or symptoms? Symptoms of this injury include:  Knee pain, especially at the side of the knee joint. You may feel pain when the injury occurs, or you may only hear a pop and feel pain later.  A feeling that your knee is clicking, catching, locking, or giving way.  Not being able to fully bend or extend your knee.  Bruising or swelling in your knee. How is this diagnosed? This injury may be diagnosed based on your symptoms and a physical exam. The physical exam may include:  Moving your knee in different ways.  Feeling for tenderness.  Listening for a clicking sound.  Checking if your knee locks or catches. You may also  have tests, such as:  X-rays.  MRI.  A procedure to look inside your knee with a narrow surgical telescope (arthroscopy). You may be referred to a knee specialist (orthopedic surgeon). How is this treated? Treatment for this injury depends on the severity of the tear. Treatment for a mild tear may include:  Rest.  Medicine to reduce pain and swelling. This is usually a nonsteroidal anti-inflammatory drug (NSAID).  A knee brace or an elastic sleeve or wrap.  Using crutches or a walker to keep weight off your knee and to help you walk.  Exercises to strengthen your knee (physical therapy). You may need surgery if you have a severe tear or if other treatments are not working. Follow these instructions at home: Managing pain and swelling  Take over-the-counter and prescription medicines only as told by your health care provider.  If directed, apply ice to the injured area:  Put ice in a plastic bag.  Place a towel between your skin and the bag.  Leave the ice on for 20 minutes, 2-3 times per day.  Raise (elevate) the injured area above the level of your heart while you are sitting or lying down. Activity  Do not use the injured limb to support your body weight until your health care provider says that you can. Use crutches or a walker as told by your health care provider.  Return to your normal activities as told by  your health care provider. Ask your health care provider what activities are safe for you.  Perform range-of-motion exercises only as told by your health care provider.  Begin doing exercises to strengthen your knee and leg muscles only as told by your health care provider. After you recover, your health care provider may recommend these exercises to help prevent another injury. General instructions  Use a knee brace or elastic wrap as told by your health care provider.  Keep all follow-up visits as told by your health care provider. This is important. Contact  a health care provider if:  You have a fever.  Your knee becomes red, tender, or swollen.  Your pain medicine is not helping.  Your symptoms get worse or do not improve after 2 weeks of home care. This information is not intended to replace advice given to you by your health care provider. Make sure you discuss any questions you have with your health care provider. Document Released: 12/31/2002 Document Revised: 03/17/2016 Document Reviewed: 02/02/2015  2017 [employer]vier

## 2016-11-11 ENCOUNTER — Telehealth: Payer: Self-pay | Admitting: *Deleted

## 2016-11-11 DIAGNOSIS — L709 Acne, unspecified: Secondary | ICD-10-CM

## 2016-11-11 DIAGNOSIS — L719 Rosacea, unspecified: Secondary | ICD-10-CM

## 2016-11-11 NOTE — Telephone Encounter (Signed)
Trying to initiate a PA for minocycline.  I may be overlooking it but didn't see a diagnosis for this med. This if for acne I assume?

## 2016-11-13 DIAGNOSIS — L709 Acne, unspecified: Secondary | ICD-10-CM | POA: Insufficient documentation

## 2016-11-14 ENCOUNTER — Encounter: Payer: Self-pay | Admitting: Family Medicine

## 2016-11-14 DIAGNOSIS — M25561 Pain in right knee: Secondary | ICD-10-CM

## 2016-11-17 NOTE — Telephone Encounter (Signed)
Yes, for acne.

## 2016-11-21 ENCOUNTER — Ambulatory Visit (INDEPENDENT_AMBULATORY_CARE_PROVIDER_SITE_OTHER): Payer: 59

## 2016-11-21 DIAGNOSIS — M25561 Pain in right knee: Secondary | ICD-10-CM

## 2016-11-22 ENCOUNTER — Encounter: Payer: Self-pay | Admitting: Family Medicine

## 2016-11-28 ENCOUNTER — Ambulatory Visit (INDEPENDENT_AMBULATORY_CARE_PROVIDER_SITE_OTHER): Payer: 59 | Admitting: Family Medicine

## 2016-11-28 VITALS — BP 135/72 | HR 76 | Wt 127.1 lb

## 2016-11-28 DIAGNOSIS — M25561 Pain in right knee: Secondary | ICD-10-CM

## 2016-11-28 NOTE — Patient Instructions (Signed)
Thank you for coming in today. You should hear from me soon about the repeat read on the MRI.  Attend PT.  Recheck in about 4 week or sooner if needed.    Semimembranosus Tendinitis Rehab Ask your health care provider which exercises are safe for you. Do exercises exactly as told by your health care provider and adjust them as directed. It is normal to feel mild stretching, pulling, tightness, or discomfort as you do these exercises, but you should stop right away if you feel sudden pain or your pain gets worse.Do not begin these exercises until told by your health care provider. Stretching and range of motion exercises These exercises warm up your muscles and joints and improve the movement and flexibility of your thigh. These exercises also help to relieve pain, numbness, and tingling. Exercise A: Hamstring stretch, supine 1. Lie on your back. Loop a belt or towel across the ball of your left / right foot The ball of your foot is on the walking surface, right under your toes. 2. Straighten your left / right knee and slowly pull on the belt to raise your leg. Stop when you feel a gentle stretch behind your left / right knee or thigh.  Do not allow the knee to bend.  Keep your other leg flat on the floor. 3. Hold this position for __________ seconds. Repeat __________ times. Complete this exercise __________ times a day. Strengthening exercises These exercises build strength and endurance in your thigh. Endurance is the ability to use your muscles for a long time, even after they get tired. Exercise B: Straight leg raises (hip extensors) 1. Lie on your belly on a bed or a firm surface with a pillow under your hips. 2. Bend your left / right knee so your foot is straight up in the air. 3. Squeeze your buttock muscles and lift your left / right thigh off the bed. Do not let your back arch. 4. Hold this position for __________seconds. 5. Slowly return to the starting position. Let your  muscles relax completely before you do another repetition. Repeat __________ times. Complete this exercise __________ times a day. Exercise C: Bridge (hip extensors) 1. Lie on your back on a firm surface with your knees bent and your feet flat on the floor. 2. Tighten your buttocks muscles and lift your bottom off the floor until your trunk is level with your thighs.  You should feel the muscles working in your buttocks and the back of your thighs. If you do not feel these muscles, slide your feet 1-2 inches (2.5-5 cm) farther away from your buttocks.  Do not arch your back. 3. Hold this position for __________ seconds. 4. Slowly lower your hips to the starting position. 5. Let your buttocks muscles relax completely between repetitions. If this exercise is too easy, try doing it with your arms crossed over your chest. Repeat __________ times. Complete this exercise __________ times a day. Exercise D: Hamstring eccentric, prone 1. Lie on your belly on a bed or on the floor. 2. Start with your legs straight. Cross your legs at the ankles with your left / right leg on top. 3. Using your bottom leg to do the work, bend both knees. 4. Using just your left / right leg alone, slowly lower your leg back down toward the bed. Add a __________ weight as told by your health care provider. 5. Let your muscles relax completely between repetitions. Repeat __________ times. Complete this exercise __________ times a  day. Exercise E: Squats 1. Stand in front of a table, with your feet and knees pointing straight ahead. You may rest your hands on the table for balance but not for support. 2. Slowly bend your knees and lower your hips like you are going to sit in a chair. Keep your thighs straight or pointed slightly outward.  Keep your weight over your heels, not over your toes.  Keep your lower legs upright so they are parallel with the table legs.  Do not let your hips go lower than your knees. Stop when  your knees are bent to the shape of an upside-down letter "L" (90 degree angle).  Do not bend lower than told by your health care provider.  If your knee pain increases, do not bend as low. 3. Hold the squat position __________ seconds. 4. Slowly push with your legs to return to standing. Do not use your hands to pull yourself to standing. Repeat __________ times. Complete this exercise __________ times a day. This information is not intended to replace advice given to you by your health care provider. Make sure you discuss any questions you have with your health care provider. Document Released: 10/10/2005 Document Revised: 06/16/2016 Document Reviewed: 07/14/2015 Elsevier Interactive Patient Education  2017 [employer].

## 2016-11-28 NOTE — Progress Notes (Signed)
Robin Patrick is a 41 y.o. female who presents to [facility] today for follow up knee pain.   Robin Patrick was last seen on Jan 15th for right knee pain. The pain occurred during a ski injury. She was though to have a medial meniscus or MCL injury. She had a MRI on Jan 29th. On return today she notes that her pain is slightly improved but still present. She notes she has pain at the medial aspect of the posterior knee with knee extension. She denies locking or catching however.   No past medical history on file. No past surgical history on file. Social History  Substance Use Topics  . Smoking status: Never Smoker  . Smokeless tobacco: Not on file  . Alcohol use Not on file     ROS:  As above   Medications: Current Outpatient Prescriptions  Medication Sig Dispense Refill  . clobetasol (TEMOVATE) 0.05 % external solution Apply 1 application topically 2 (two) times daily. To scalp as needed 50 mL 12  . clotrimazole-betamethasone (LOTRISONE) cream Apply 1 application topically 2 (two) times daily. 45 g 3  . Diclofenac Sodium 2 % SOLN Place 2 sprays onto the skin 2 (two) times daily. 1 Bottle 11  . fluticasone (FLONASE) 50 MCG/ACT nasal spray Place 2 sprays into both nostrils daily. 16 g 6  . ibuprofen (ADVIL,MOTRIN) 400 MG tablet Take 1 tablet (400 mg total) by mouth every 6 (six) hours as needed for mild pain or moderate pain. 60 tablet 1  . ketoconazole (NIZORAL) 2 % shampoo Apply 1 application topically 2 (two) times a week. 120 mL 0  . LUTERA 0.1-20 MG-MCG tablet Take 1 tablet by mouth daily. 1 Package 12  . metroNIDAZOLE (METROGEL) 1 % gel Apply topically daily.    . minocycline (DYNACIN) 100 MG tablet Take 1 tablet (100 mg total) by mouth 2 (two) times daily. As needed for rosacea 30 tablet 3  . valACYclovir (VALTREX) 1000 MG tablet Take 1 tablet (1,000 mg total) by mouth 2 (two) times daily as needed. 30 tablet 0   No current  facility-administered medications for this visit.    Allergies  Allergen Reactions  . Erythromycin Nausea Only     Exam:  BP 135/72   Pulse 76   Wt 127 lb 1.9 oz (57.7 kg)   LMP 11/07/2016   SpO2 100%   BMI 21.82 kg/m  General: Well Developed, well nourished, and in no acute distress.  Neuro/Psych: Alert and oriented x3, extra-ocular muscles intact, able to move all 4 extremities, sensation grossly intact. Skin: Warm and dry, no rashes noted.  Respiratory: Not using accessory muscles, speaking in full sentences, trachea midline.  Cardiovascular: Pulses palpable, no extremity edema. Abdomen: Does not appear distended. MSK: Right knee is normal-appearing. Range of motion full extension over with pain beyond about 3 normal flexion. Tender to palpation posterior medial knee especially at the semimembranosus and semitendinosus and at the medial posterior joint line. Mildly positive medial McMurray's test negative lateral. No pain or significant laxity with MCL or LCL stress. Negative tear posterior drawer test. Intact extension and flexion strength.   Study Result   CLINICAL DATA:  Right knee pain since a fall while skiing 1 month ago.  EXAM: MRI OF THE RIGHT KNEE WITHOUT CONTRAST  TECHNIQUE: Multiplanar, multisequence MR imaging of the knee was performed. No intravenous contrast was administered.  COMPARISON:  None.  FINDINGS: MENISCI  Medial meniscus:  Intact.  Lateral meniscus:  Intact.  LIGAMENTS  Cruciates:  Intact.  Collaterals:  Intact.  CARTILAGE  Patellofemoral:  Negative.  Medial:  Negative.  Lateral:  Negative.  Joint:  Trace joint effusion.  Popliteal Fossa:  No Baker's cyst.  Extensor Mechanism:  Intact.  Bones:  Normal marrow signal throughout.  Other: None.  IMPRESSION: Negative for meniscal or ligament tear.  Negative right knee MRI.   Electronically Signed   By: Drusilla Kanner M.D.   On: 11/21/2016  12:30       No results found for this or any previous visit (from the past 48 hour(s)). No results found.    Assessment and Plan: 41 y.o. female with right knee pain. At this time etiology is somewhat unclear. I was expecting a meniscus injury. I think it is possible that she does have a strain of the semimembranosus or semitendinosus however that is not very well seen on MRI. Plan for trial of physical therapy and recheck in about 4 weeks.    Orders Placed This Encounter  Procedures  . Ambulatory referral to Physical Therapy    Referral Priority:   Routine    Referral Type:   Physical Medicine    Referral Reason:   Specialty Services Required    Requested Specialty:   Physical Therapy    Number of Visits Requested:   1    Discussed warning signs or symptoms. Please see discharge instructions. Patient expresses understanding.

## 2016-11-29 NOTE — Telephone Encounter (Signed)
Request for prior auth sent to insurance. Awaiting response.

## 2016-11-30 DIAGNOSIS — L719 Rosacea, unspecified: Secondary | ICD-10-CM | POA: Insufficient documentation

## 2016-11-30 NOTE — Telephone Encounter (Signed)
Minocycline has been approved. Called patient and pharmacy. Can you please add the diagnosis of rosacea to the problem list? She actually takes this med for rosacea. That makes sense since she is also on metrogel.

## 2016-11-30 NOTE — Telephone Encounter (Signed)
Added

## 2016-12-02 ENCOUNTER — Encounter: Payer: Self-pay | Admitting: Osteopathic Medicine

## 2016-12-04 MED ORDER — MINOCYCLINE HCL 100 MG PO CAPS: 100.0000 mg | ORAL_CAPSULE | Freq: Two times a day (BID) | ORAL | 1 refills | Status: AC

## 2019-07-02 ENCOUNTER — Ambulatory Visit (INDEPENDENT_AMBULATORY_CARE_PROVIDER_SITE_OTHER): Payer: 59 | Admitting: Family Medicine

## 2019-07-02 ENCOUNTER — Ambulatory Visit (INDEPENDENT_AMBULATORY_CARE_PROVIDER_SITE_OTHER): Payer: 59

## 2019-07-02 ENCOUNTER — Other Ambulatory Visit: Payer: Self-pay

## 2019-07-02 VITALS — BP 129/84 | HR 67 | Temp 98.7°F | Wt 136.0 lb

## 2019-07-02 DIAGNOSIS — M25512 Pain in left shoulder: Secondary | ICD-10-CM

## 2019-07-02 DIAGNOSIS — M25552 Pain in left hip: Secondary | ICD-10-CM

## 2019-07-02 DIAGNOSIS — M25551 Pain in right hip: Secondary | ICD-10-CM

## 2019-07-02 NOTE — Patient Instructions (Signed)
Thank you for coming in today. Get xray now on your way out.  I will look at the xray today and get the report to you ASAP when the radiologist generates it likely tomorrow.  Attend PT.  Recheck in 4 weeks.  Return sooner if needed.  If all better no need for recheck.

## 2019-07-02 NOTE — Progress Notes (Signed)
Robin Patrick is a 43 y.o. right-hand-dominant female who presents to [facility] today for left shoulder pain. Patient notes left shoulder pain ongoing for about a year.  She notes pain with lifting including push-up overhead shoulder press and swimming.  She is modified her activity and done rest and some limited home rehab with little benefit.  She has pain is located primarily in the lateral upper arm worse with overhead activity.  She denies any pain radiating below the level of the elbow.  She denies significant night pain.  She denies any injury.  She also notes pain or tight sensation in her hips bilaterally.  She notes a tightness in the anterior aspect of her hip and some pain or tightness at the lateral hip.  She notes that she is been running more recently and notes that this is been slightly worsening.  She notes the sensation also occurs when she lays on her side bilaterally.  She has a history of left labrum tear during pregnancy a few years ago that was well managed with femoral acetabular steroid injection with no recurrence.  She denies any injury radiating pain weakness or numbness distally.   ROS:  As above  Exam:  BP 129/84   Pulse 67   Temp 98.7 F (37.1 C) (Oral)   Wt 136 lb (61.7 kg)   BMI 23.34 kg/m  Wt Readings from Last 5 Encounters:  07/02/19 136 lb (61.7 kg)  11/28/16 127 lb 1.9 oz (57.7 kg)  11/07/16 125 lb (56.7 kg)  11/01/16 122 lb (55.3 kg)  12/17/15 126 lb (57.2 kg)   General: Well Developed, well nourished, and in no acute distress.  Neuro/Psych: Alert and oriented x3, extra-ocular muscles intact, able to move all 4 extremities, sensation grossly intact. Skin: Warm and dry, no rashes noted.  Respiratory: Not using accessory muscles, speaking in full sentences, trachea midline.  Cardiovascular: Pulses palpable, no extremity edema. Abdomen: Does not appear distended. MSK:  Left shoulder: Normal-appearing  normal motion.  Some pain with abduction beyond 100 degrees. Not particularly tender to palpation. Intact strength to abduction external and internal rotation. Mildly positive empty can test. Negative Yergason's and speeds test. Positive crossover arm compression test. Normal stability testing.  Right shoulder normal-appearing nontender normal motion normal strength.  Pulses cap refill and sensation intact bilateral upper extremities.  Left hip: Normal-appearing Normal range of motion however some pain with FADIR test Strength slightly reduced and hip abduction 4+/5. Mildly tender palpation greater trochanter.  Right hip normal-appearing Normal range of motion however again some pain with FADIR test. Straightly reduced hip abduction strength 4+/5. Tender palpation greater trochanter.    Lab and Radiology Results No results found for this or any previous visit (from the past 72 hour(s)). No results found.  X-ray images left shoulder and AP pelvis obtained today personally independently reviewed.  Left shoulder: No acute fractures or severe degenerative changes.  Possible old grade 1 AC strain visible with slight elevation of distal end of clavicle.  AP pelvis bilateral no significant degenerative changes AVN or femoral neck stress fracture visible.  No severe femoral acetabular impingement characteristics present.  Await formal radiology review   Assessment and Plan: 43 y.o. female with left shoulder pain.  Been present for about a year now.  Etiology is somewhat unclear.  Pain is likely AC joint related possible rotator cuff tendinopathy.  She likely also has some scapular dyskinesis as well.  Plan to proceed with trial  of physical therapy.  If not improving recheck back in 1 month likely proceed with injection and potential MRI for surgical planning.  Hip pain: Patient has anterior and lateral hip pain.  She does have a history of labrum tear in the left hip therefore femoral  acetabular impingement is always a possibility.  Plan for physical therapy if not improving will reassess.  Could consider injection versus MRI arthrogram if needed.   PDMP not reviewed this encounter. Orders Placed This Encounter  Procedures  . DG Shoulder Left    Standing Status:   Future    Number of Occurrences:   1    Standing Expiration Date:   08/31/2020    Order Specific Question:   Reason for Exam (SYMPTOM  OR DIAGNOSIS REQUIRED)    Answer:   eval left shoulder pain suspect AC joint.    Order Specific Question:   Is patient pregnant?    Answer:   No    Order Specific Question:   Preferred imaging location?    Answer:   [facility]    Order Specific Question:   Radiology Contrast Protocol - do NOT remove file path    Answer:   \\[email or web address]\[email or web address].pdf  . DG Pelvis 1-2 Views    Standing Status:   Future    Number of Occurrences:   1    Standing Expiration Date:   08/31/2020    Order Specific Question:   Reason for Exam (SYMPTOM  OR DIAGNOSIS REQUIRED)    Answer:   hip pain BL. Hx labrum tear. Eval FAI    Order Specific Question:   Is patient pregnant?    Answer:   No    Order Specific Question:   Preferred imaging location?    Answer:   [facility]    Order Specific Question:   Radiology Contrast Protocol - do NOT remove file path    Answer:   \\[email or web address]\[email or web address]\DXFluoroContrastProtocols.pdf  . Ambulatory referral to Physical Therapy    Referral Priority:   Routine    Referral Type:   Physical Medicine    Referral Reason:   Specialty Services Required    Requested Specialty:   Physical Therapy    Number of Visits Requested:   1   No orders of the defined types were placed in this encounter.   Historical information moved to improve visibility of documentation.  No past medical history on file. No past surgical history on file. Social History   Tobacco Use  . Smoking status: Never Smoker  Substance  Use Topics  . Alcohol use: Not on file   family history includes Cancer in her maternal grandfather, maternal grandmother, and mother; Cancer (age of onset: 84) in her sister.  Medications: Current Outpatient Medications  Medication Sig Dispense Refill  . clobetasol (TEMOVATE) 0.05 % external solution Apply 1 application topically 2 (two) times daily. To scalp as needed 50 mL 12  . clotrimazole-betamethasone (LOTRISONE) cream Apply 1 application topically 2 (two) times daily. 45 g 3  . Diclofenac Sodium 2 % SOLN Place 2 sprays onto the skin 2 (two) times daily. 1 Bottle 11  . fluticasone (FLONASE) 50 MCG/ACT nasal spray Place 2 sprays into both nostrils daily. 16 g 6  . ibuprofen (ADVIL,MOTRIN) 400 MG tablet Take 1 tablet (400 mg total) by mouth every 6 (six) hours as needed for mild pain or moderate pain. 60 tablet 1  . ketoconazole (NIZORAL) 2 % shampoo Apply 1 application topically 2 (two) times a week. 120 mL  0  . LUTERA 0.1-20 MG-MCG tablet Take 1 tablet by mouth daily. 1 Package 12  . metroNIDAZOLE (METROGEL) 1 % gel Apply topically daily.    . minocycline (MINOCIN,DYNACIN) 100 MG capsule Take 1 capsule (100 mg total) by mouth 2 (two) times daily. 180 capsule 1  . valACYclovir (VALTREX) 1000 MG tablet Take 1 tablet (1,000 mg total) by mouth 2 (two) times daily as needed. 30 tablet 0   No current facility-administered medications for this visit.    Allergies  Allergen Reactions  . Erythromycin Nausea Only      Discussed warning signs or symptoms. Please see discharge instructions. Patient expresses understanding.

## 2019-07-11 ENCOUNTER — Telehealth: Payer: Self-pay | Admitting: Family Medicine

## 2019-07-11 NOTE — Telephone Encounter (Signed)
Received physical therapy report from [facility]. Left shoulder bilateral hip.  Asymmetry shoulder range of motion and upper extremity weakness increased tissue tension upper quarter. Increased tissue tissue bilateral TF L, and hip flexor weakness of lower extremity.  Plan to proceed with PT

## 2019-07-26 DIAGNOSIS — M25512 Pain in left shoulder: Secondary | ICD-10-CM | POA: Diagnosis not present

## 2019-07-26 DIAGNOSIS — M6281 Muscle weakness (generalized): Secondary | ICD-10-CM | POA: Diagnosis not present

## 2019-07-26 DIAGNOSIS — M25551 Pain in right hip: Secondary | ICD-10-CM | POA: Diagnosis not present

## 2019-07-26 DIAGNOSIS — M25552 Pain in left hip: Secondary | ICD-10-CM | POA: Diagnosis not present

## 2019-07-29 DIAGNOSIS — M25512 Pain in left shoulder: Secondary | ICD-10-CM | POA: Diagnosis not present

## 2019-07-29 DIAGNOSIS — M6281 Muscle weakness (generalized): Secondary | ICD-10-CM | POA: Diagnosis not present

## 2019-07-29 DIAGNOSIS — M25552 Pain in left hip: Secondary | ICD-10-CM | POA: Diagnosis not present

## 2019-07-29 DIAGNOSIS — M25551 Pain in right hip: Secondary | ICD-10-CM | POA: Diagnosis not present

## 2019-07-31 ENCOUNTER — Ambulatory Visit: Payer: 59 | Admitting: Family Medicine

## 2019-08-05 DIAGNOSIS — M25512 Pain in left shoulder: Secondary | ICD-10-CM | POA: Diagnosis not present

## 2019-08-05 DIAGNOSIS — M25551 Pain in right hip: Secondary | ICD-10-CM | POA: Diagnosis not present

## 2019-08-05 DIAGNOSIS — M25552 Pain in left hip: Secondary | ICD-10-CM | POA: Diagnosis not present

## 2019-08-05 DIAGNOSIS — M6281 Muscle weakness (generalized): Secondary | ICD-10-CM | POA: Diagnosis not present

## 2019-08-07 DIAGNOSIS — M25551 Pain in right hip: Secondary | ICD-10-CM | POA: Diagnosis not present

## 2019-08-07 DIAGNOSIS — M25512 Pain in left shoulder: Secondary | ICD-10-CM | POA: Diagnosis not present

## 2019-08-07 DIAGNOSIS — M25552 Pain in left hip: Secondary | ICD-10-CM | POA: Diagnosis not present

## 2019-08-07 DIAGNOSIS — M6281 Muscle weakness (generalized): Secondary | ICD-10-CM | POA: Diagnosis not present

## 2019-08-15 ENCOUNTER — Other Ambulatory Visit: Payer: Self-pay

## 2019-08-15 ENCOUNTER — Ambulatory Visit: Payer: BC Managed Care – PPO | Admitting: Family Medicine

## 2019-08-15 VITALS — BP 124/79 | HR 78 | Temp 98.2°F | Wt 136.0 lb

## 2019-08-15 DIAGNOSIS — M25552 Pain in left hip: Secondary | ICD-10-CM | POA: Diagnosis not present

## 2019-08-15 DIAGNOSIS — M25512 Pain in left shoulder: Secondary | ICD-10-CM | POA: Diagnosis not present

## 2019-08-15 DIAGNOSIS — M25551 Pain in right hip: Secondary | ICD-10-CM | POA: Diagnosis not present

## 2019-08-15 MED ORDER — BUSPIRONE HCL 10 MG PO TABS: ORAL_TABLET | ORAL | 0 refills | Status: AC

## 2019-08-15 NOTE — Progress Notes (Signed)
Robin Patrick is a 43 y.o. female who presents to [facility] today for follow-up left shoulder pain and bilateral hip pain. Patient was seen on September 8 for left shoulder pain bilateral hip pain.  Left shoulder pain: Present for about a year thought to be [facility] joint or rotator cuff tendinopathy as well as scapular dyskinesis.  Proceeded with physical therapy at [facility].  In the interim Robin Patrick she is not had dramatic improvement.  She had some mild improvement with physical therapy but discontinued she tried to start lifting weights even a little her pain returned.  She notes popping and catching in her shoulder with almost every attempt at abduction.  She is able to abduct her arm fully but notes she has to maneuver her shoulder to avoid locking or catching.  She notes her shoulder dysfunction and pain is significantly interfering with her ability to have a normal exercise life and is very obnoxious. She notes her shoulder pain is much worse and more obnoxious than her hip symptoms.   Bilateral hip pain anterior lateral hips.   Patient did have a history of labrum tear in the left hip.  X-ray in September was unremarkable.  Pain was thought to be possibly femoral acetabular impingement or more myofascial/musculotendinous.  Plan to treat with physical therapy and if not better potentially proceed with MRI arthrogram.  In the interim Robin Patrick notes she has had some mild improvement in her bilateral anterior and lateral hip pain with physical therapy.  She notes she still feels tight but is able to do normal activities such as running and exercise.  She notes that her symptoms worsen if she rides a bike or does spin. Her sister was diagnosed with a similar labrum tear history and just had hip scope surgery by Dr. Aretha Parrot in [place].   ROS:  As above  Exam:  BP 124/79   Pulse 78   Temp 98.2 F (36.8 C) (Oral)   Wt 136 lb (61.7 kg)   BMI 23.34 kg/m  Wt  Readings from Last 5 Encounters:  08/15/19 136 lb (61.7 kg)  07/02/19 136 lb (61.7 kg)  11/28/16 127 lb 1.9 oz (57.7 kg)  11/07/16 125 lb (56.7 kg)  11/01/16 122 lb (55.3 kg)   General: Well Developed, well nourished, and in no acute distress.  Neuro/Psych: Alert and oriented x3, extra-ocular muscles intact, able to move all 4 extremities, sensation grossly intact. Skin: Warm and dry, no rashes noted.  Respiratory: Not using accessory muscles, speaking in full sentences, trachea midline.  Cardiovascular: Pulses palpable, no extremity edema. Abdomen: Does not appear distended. MSK:  Left shoulder: Normal-appearing Particularly tender to palpation. Mild scapular protraction. Palpable click with abduction. Range of motion: Normal abduction external rotation and internal rotation. Positive Hawkins and Neer's test.  Positive empty can test.  Negative Yergason's and speeds test.  Positive O'Brien's test.  Bilateral hips normal-appearing nontender normal motion normal gait    Lab and Radiology Results Dg Pelvis 1-2 Views  Result Date: 07/03/2019 CLINICAL DATA:  Pain EXAM: PELVIS - 1-2 VIEW COMPARISON:  None. FINDINGS: There is no evidence of pelvic fracture or dislocation. Joint spaces appear normal. No erosive change. IMPRESSION: No fracture or dislocation.  No evident arthropathy. Electronically Signed   By: Lowella Grip III M.D.   On: 07/03/2019 08:12   Dg Shoulder Left  Result Date: 07/03/2019 CLINICAL DATA:  Pain EXAM: LEFT SHOULDER - 2+ VIEW COMPARISON:  None. FINDINGS: Frontal, Y scapular,  axillary images obtained. No fracture or dislocation. There is slight narrowing of the acromioclavicular joint. Glenohumeral joint appears unremarkable. No erosive change or intra-articular calcification. Visualized left lung clear. IMPRESSION: Slight narrowing left acromioclavicular joint. Glenohumeral joint appears normal. No erosive change. No fracture or dislocation. Electronically Signed    By: Bretta Bang III M.D.   On: 07/03/2019 08:13   I personally (independently) visualized and performed the interpretation of the images attached in this note.     Assessment and Plan: 43 y.o. female with  Left shoulder pain.  Concerning for labrum tear as well as subacromial impingement.  Plan for MRI arthrogram for surgical planning recheck via remote visit after MRI to discuss findings and next steps.  Bilateral hip tightness.  Symptoms are much more mild but still mildly obnoxious.  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  If not improving following shoulder work-up and likely surgery then will likely proceed with further hip evaluation.  Lengthy discussion that surgical outcomes are usually not very good for femoral acetabular impingement especially when relatively mild.  I spent 25 minutes with this patient, greater than 50% was face-to-face time counseling regarding differential diagnosis treatment plan and options.Marland Kitchen  PDMP not reviewed this encounter. Orders Placed This Encounter  Procedures  . MR SHOULDER LEFT W CONTRAST    Standing Status:   Future    Standing Expiration Date:   10/14/2020    Order Specific Question:   ** REASON FOR EXAM (FREE TEXT)    Answer:   MRI arthrogram left shoulder for labrum evaluation.  Patient should schedule with Dr. Denyse Amass or Dr. Benjamin Stain 1 hour prior to MRI.    Order Specific Question:   If indicated for the ordered procedure, I authorize the administration of contrast media per Radiology protocol    Answer:   Yes    Order Specific Question:   What is the patient's sedation requirement?    Answer:   Anti-anxiety    Order Specific Question:   Does the patient have a pacemaker or implanted devices?    Answer:   No    Order Specific Question:   Radiology Contrast Protocol - do NOT remove file path    Answer:   \\[email or web address]    Order Specific Question:    Preferred imaging location?    Answer:   [facility] (table limit-350lbs)   Meds ordered this encounter  Medications  . busPIRone (BUSPAR) 10 MG tablet    Sig: Use buspar 1 pill prior to MRI for anxiety    Dispense:  3 tablet    Refill:  0    Historical information moved to improve visibility of documentation.  No past medical history on file. No past surgical history on file. Social History   Tobacco Use  . Smoking status: Never Smoker  Substance Use Topics  . Alcohol use: Not on file   family history includes Cancer in her maternal grandfather, maternal grandmother, and mother; Cancer (age of onset: 2) in her sister.  Medications: Current Outpatient Medications  Medication Sig Dispense Refill  . busPIRone (BUSPAR) 10 MG tablet Use buspar 1 pill prior to MRI for anxiety 3 tablet 0  . clobetasol (TEMOVATE) 0.05 % external solution Apply 1 application topically 2 (two) times daily. To scalp as needed 50 mL 12  . clotrimazole-betamethasone (LOTRISONE) cream Apply 1 application topically 2 (two) times daily. 45 g 3  . Diclofenac Sodium 2 % SOLN Place 2 sprays  onto the skin 2 (two) times daily. 1 Bottle 11  . fluticasone (FLONASE) 50 MCG/ACT nasal spray Place 2 sprays into both nostrils daily. 16 g 6  . ibuprofen (ADVIL,MOTRIN) 400 MG tablet Take 1 tablet (400 mg total) by mouth every 6 (six) hours as needed for mild pain or moderate pain. 60 tablet 1  . ketoconazole (NIZORAL) 2 % shampoo Apply 1 application topically 2 (two) times a week. 120 mL 0  . LUTERA 0.1-20 MG-MCG tablet Take 1 tablet by mouth daily. 1 Package 12  . metroNIDAZOLE (METROGEL) 1 % gel Apply topically daily.    . minocycline (MINOCIN,DYNACIN) 100 MG capsule Take 1 capsule (100 mg total) by mouth 2 (two) times daily. 180 capsule 1  . valACYclovir (VALTREX) 1000 MG tablet Take 1 tablet (1,000 mg total) by mouth 2 (two) times daily as needed. 30 tablet 0   No current facility-administered medications  for this visit.    Allergies  Allergen Reactions  . Erythromycin Nausea Only      Discussed warning signs or symptoms. Please see discharge instructions. Patient expresses understanding.

## 2019-08-15 NOTE — Patient Instructions (Addendum)
Thank you for coming in today. You should hear soon about MRI arthrogram for the shoulder.    SLAP Lesions  Superior labrum anterior posterior (SLAP) lesions are injuries to part of the connective tissue (cartilage) of the shoulder joint. The top of the upper arm bone (humerus) fits into a socket in the shoulder blade to form the shoulder joint. There is a firm rim of cartilage (labrum) around the edge of the socket. The labrum helps to deepen the socket and hold the humerus in place. If a certain part of the labrum becomes frayed or torn, it is called a SLAP lesion. A SLAP lesion can cause shoulder pain, instability, and weakness. SLAP lesions are common among athletes who play sports that involve repeated overhead movements. SLAP lesions may include a tear in the cord of tissue that attaches the muscle in the front of the upper arm to the shoulder blade (proximal biceps tendon). What are the causes? This condition may be caused by:  A sudden (acute) injury, which can result from: ? Falling on an outstretched arm. ? Movement of the shoulder joint out of its normal place (dislocation). ? A direct hit to the shoulder.  Wear and tear over time, which can result from doing activities or sports that involve overhead arm movements. What increases the risk? The following factors may make you more likely to develop this condition:  Having had a dislocated shoulder in the past.  Being age 42 or older.  Playing certain sports, such as: ? Sports that involve repeated overhead movements, such as baseball or volleyball. ? Sports that put backward pressure on the arms when the arms are overhead, such as gymnastics or basketball. ? Contact sports.  Lifting weights. What are the signs or symptoms? The main symptom of this condition is shoulder pain that gets worse when lifting a heavy object or raising the arm overhead. Other symptoms include:  Feeling like your shoulder is locking, catching,  grinding, or popping.  Loss of strength.  Stiffness and limited range of motion.  Loss of throwing power. How is this diagnosed? This condition may be diagnosed based on:  Your symptoms.  Your medical history.  A physical exam.  Imaging tests, such as an MRI or a CT scan. How is this treated? Treatment for this condition may include:  Resting your shoulder by avoiding activities that cause shoulder pain.  Taking NSAIDs to help reduce pain and swelling.  Having physical therapy to improve strength and range of motion.  Having surgery. This may be done if other treatment methods do not help. Surgery may involve: ? Removing frayed pieces of the labrum. ? Repairing tears. ? Reattaching the labrum. ? Repairing the biceps tendon. Follow these instructions at home: Managing pain, stiffness, and swelling   If directed, put ice on the injured area. ? Put ice in a plastic bag. ? Place a towel between your skin and the bag. ? Leave the ice on for 20 minutes, 2-3 times a day.  Move your fingers often to reduce stiffness and swelling.  Raise (elevate) the injured area above the level of your heart while you are sitting or lying down. Activity  Do exercises as told by your health care provider.  Return to your normal activities as told by your health care provider. Ask your health care provider what activities are safe for you.  Ask your health care provider when it is safe for you to drive while your shoulder is healing. General instructions  Take over-the-counter and prescription medicines only as told by your health care provider.  Do not use any products that contain nicotine or tobacco, such as cigarettes, e-cigarettes, and chewing tobacco. If you need help quitting, ask your health care provider.  Keep all follow-up visits as told by your health care provider. This is important. How is this prevented?  Warm up and stretch before being active.  Cool down and  stretch after being active.  Give your body time to rest between periods of activity.  Maintain physical fitness, including strength and flexibility.  Be safe and responsible while being active. This will help you to avoid falls. Contact a health care provider if:  Your symptoms have not improved after 6 months of treatment.  Your symptoms get worse instead of getting better. Summary  Superior labrum anterior posterior (SLAP) lesions are injuries to part of the connective tissue (cartilage) of the shoulder joint.  This condition is usually caused by a sudden (acute) injury due to a fall or direct hit to the shoulder. It can also be caused by wear and tear over time, which can result from doing activities or sports that involve overhead arm movements.  It may be treated with rest, medicines, physical therapy, and surgery.  Contact a health care provider if your symptoms get worse instead of getting better. This information is not intended to replace advice given to you by your health care provider. Make sure you discuss any questions you have with your health care provider. Document Released: 10/10/2005 Document Revised: 11/27/2018 Document Reviewed: 11/27/2018 Elsevier Patient Education  2020 [employer].

## 2019-08-16 ENCOUNTER — Telehealth: Payer: Self-pay | Admitting: Family Medicine

## 2019-08-16 NOTE — Telephone Encounter (Signed)
Shoulder MRI arthrogram authorized. Authorization number [id number] valid until February 11, 2020.  Should schedule I hour prior to MRI with Dr. Darene Lamer or myself for injection. Radiology has been informed and they should contact the patient.

## 2019-08-19 ENCOUNTER — Encounter: Payer: Self-pay | Admitting: Family Medicine

## 2019-08-19 MED ORDER — LORAZEPAM 0.5 MG PO TABS: ORAL_TABLET | ORAL | 0 refills | Status: AC

## 2019-08-20 ENCOUNTER — Ambulatory Visit (INDEPENDENT_AMBULATORY_CARE_PROVIDER_SITE_OTHER): Payer: BC Managed Care – PPO | Admitting: Family Medicine

## 2019-08-20 ENCOUNTER — Ambulatory Visit (INDEPENDENT_AMBULATORY_CARE_PROVIDER_SITE_OTHER): Payer: BC Managed Care – PPO

## 2019-08-20 ENCOUNTER — Other Ambulatory Visit: Payer: Self-pay

## 2019-08-20 VITALS — BP 128/52 | HR 81 | Temp 98.3°F | Wt 135.0 lb

## 2019-08-20 DIAGNOSIS — M25512 Pain in left shoulder: Secondary | ICD-10-CM | POA: Diagnosis not present

## 2019-08-20 DIAGNOSIS — S43432A Superior glenoid labrum lesion of left shoulder, initial encounter: Secondary | ICD-10-CM | POA: Diagnosis not present

## 2019-08-20 NOTE — Progress Notes (Signed)
    Patient presents to clinic for  previously scheduled gadolinium interarticular contrast.  Procedure: Real-time Ultrasound Guided Injection of left GH joint  Device: GE Logiq E  Images permanently stored and available for review in the ultrasound unit. Verbal informed consent obtained. Discussed risks and benefits of procedure. Warned about infection bleeding damage to structures skin hypopigmentation and fat atrophy among others. Patient expresses understanding and agreement Time-out conducted.  Noted no overlying erythema, induration, or other signs of local infection.  Skin prepped in a sterile fashion.  Local anesthesia: Topical Ethyl chloride.  With sterile technique and under real time ultrasound guidance: 5 mL of Marcaine, 0.1 mL of gadolinium contrast, 4 mL of sterile saline injected easily.  Completed without difficulty    Advised to call if fevers/chills, erythema, induration, drainage, or persistent bleeding.  Images permanently stored and available for review in the ultrasound unit.  Impression: Technically successful ultrasound guided injection.

## 2019-08-20 NOTE — Patient Instructions (Signed)
Thank you for coming in today. Call or go to the ER if you develop a large red swollen joint with extreme pain or oozing puss.  Go to MRI now.  Take 2nd pill.  Do not move your shoulder a lot until after MRI.  Pay attention to pain for a few hours.

## 2019-08-21 ENCOUNTER — Telehealth: Payer: Self-pay | Admitting: Family Medicine

## 2019-08-21 DIAGNOSIS — S73192D Other sprain of left hip, subsequent encounter: Secondary | ICD-10-CM

## 2019-08-21 DIAGNOSIS — M25512 Pain in left shoulder: Secondary | ICD-10-CM

## 2019-08-21 NOTE — Telephone Encounter (Signed)
Referral to orthopedic surgery for labrum tear.

## 2019-08-28 DIAGNOSIS — B078 Other viral warts: Secondary | ICD-10-CM | POA: Diagnosis not present

## 2019-08-28 DIAGNOSIS — L738 Other specified follicular disorders: Secondary | ICD-10-CM | POA: Diagnosis not present

## 2019-08-28 DIAGNOSIS — L720 Epidermal cyst: Secondary | ICD-10-CM | POA: Diagnosis not present

## 2019-08-28 DIAGNOSIS — L718 Other rosacea: Secondary | ICD-10-CM | POA: Diagnosis not present

## 2019-09-06 DIAGNOSIS — S43432A Superior glenoid labrum lesion of left shoulder, initial encounter: Secondary | ICD-10-CM | POA: Diagnosis not present

## 2019-09-18 ENCOUNTER — Encounter: Payer: Self-pay | Admitting: Family Medicine

## 2019-09-23 MED ORDER — TRAZODONE HCL 50 MG PO TABS: 25.0000 mg | ORAL_TABLET | Freq: Every evening | ORAL | 0 refills | Status: AC | PRN

## 2019-09-24 DIAGNOSIS — M7542 Impingement syndrome of left shoulder: Secondary | ICD-10-CM | POA: Diagnosis not present

## 2019-09-24 DIAGNOSIS — G8918 Other acute postprocedural pain: Secondary | ICD-10-CM | POA: Diagnosis not present

## 2019-09-24 DIAGNOSIS — S43432A Superior glenoid labrum lesion of left shoulder, initial encounter: Secondary | ICD-10-CM | POA: Diagnosis not present

## 2019-09-27 IMAGING — DX DG PELVIS 1-2V
1 series · 1 of 1 positions shown · non-contrast
Comparison: None.

CLINICAL DATA: Pain

EXAM:
PELVIS - 1-2 VIEW

[pelvis ap]
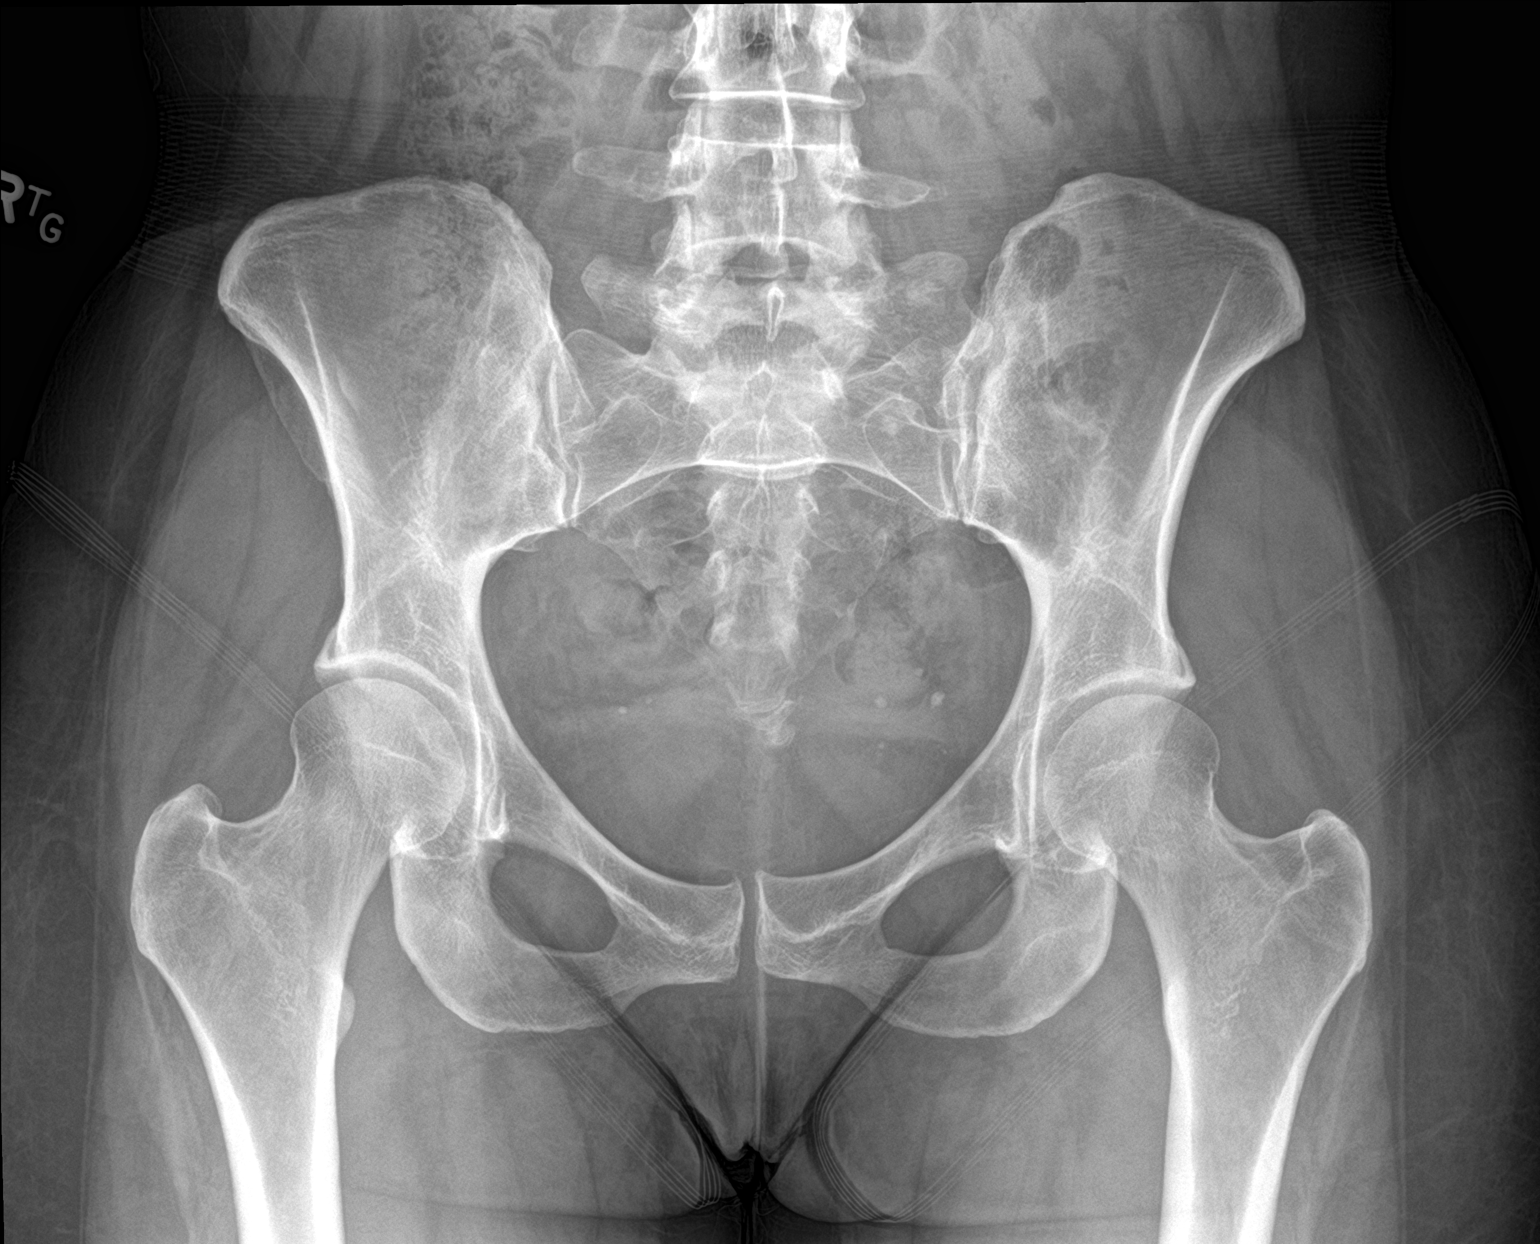

[1 of 1 positions shown; findings below may reference images not displayed]

FINDINGS: There is no evidence of pelvic fracture or dislocation. Joint spaces
appear normal. No erosive change.
IMPRESSION: No fracture or dislocation.  No evident arthropathy.

## 2019-10-02 DIAGNOSIS — Z9889 Other specified postprocedural states: Secondary | ICD-10-CM | POA: Diagnosis not present

## 2019-10-03 DIAGNOSIS — M6281 Muscle weakness (generalized): Secondary | ICD-10-CM | POA: Diagnosis not present

## 2019-10-03 DIAGNOSIS — M25512 Pain in left shoulder: Secondary | ICD-10-CM | POA: Diagnosis not present

## 2019-10-03 DIAGNOSIS — M25612 Stiffness of left shoulder, not elsewhere classified: Secondary | ICD-10-CM | POA: Diagnosis not present

## 2019-10-08 DIAGNOSIS — M25612 Stiffness of left shoulder, not elsewhere classified: Secondary | ICD-10-CM | POA: Diagnosis not present

## 2019-10-08 DIAGNOSIS — M25512 Pain in left shoulder: Secondary | ICD-10-CM | POA: Diagnosis not present

## 2019-10-08 DIAGNOSIS — M6281 Muscle weakness (generalized): Secondary | ICD-10-CM | POA: Diagnosis not present

## 2019-10-10 DIAGNOSIS — M6281 Muscle weakness (generalized): Secondary | ICD-10-CM | POA: Diagnosis not present

## 2019-10-10 DIAGNOSIS — M25612 Stiffness of left shoulder, not elsewhere classified: Secondary | ICD-10-CM | POA: Diagnosis not present

## 2019-10-10 DIAGNOSIS — M25512 Pain in left shoulder: Secondary | ICD-10-CM | POA: Diagnosis not present

## 2019-10-14 DIAGNOSIS — M25612 Stiffness of left shoulder, not elsewhere classified: Secondary | ICD-10-CM | POA: Diagnosis not present

## 2019-10-14 DIAGNOSIS — M25512 Pain in left shoulder: Secondary | ICD-10-CM | POA: Diagnosis not present

## 2019-10-14 DIAGNOSIS — M6281 Muscle weakness (generalized): Secondary | ICD-10-CM | POA: Diagnosis not present

## 2019-10-16 DIAGNOSIS — M25512 Pain in left shoulder: Secondary | ICD-10-CM | POA: Diagnosis not present

## 2019-10-16 DIAGNOSIS — M6281 Muscle weakness (generalized): Secondary | ICD-10-CM | POA: Diagnosis not present

## 2019-10-16 DIAGNOSIS — M25612 Stiffness of left shoulder, not elsewhere classified: Secondary | ICD-10-CM | POA: Diagnosis not present

## 2019-10-21 DIAGNOSIS — M25612 Stiffness of left shoulder, not elsewhere classified: Secondary | ICD-10-CM | POA: Diagnosis not present

## 2019-10-21 DIAGNOSIS — M25512 Pain in left shoulder: Secondary | ICD-10-CM | POA: Diagnosis not present

## 2019-10-21 DIAGNOSIS — M6281 Muscle weakness (generalized): Secondary | ICD-10-CM | POA: Diagnosis not present

## 2019-10-23 DIAGNOSIS — M25512 Pain in left shoulder: Secondary | ICD-10-CM | POA: Diagnosis not present

## 2019-10-23 DIAGNOSIS — M6281 Muscle weakness (generalized): Secondary | ICD-10-CM | POA: Diagnosis not present

## 2019-10-23 DIAGNOSIS — M25612 Stiffness of left shoulder, not elsewhere classified: Secondary | ICD-10-CM | POA: Diagnosis not present

## 2019-10-29 DIAGNOSIS — M6281 Muscle weakness (generalized): Secondary | ICD-10-CM | POA: Diagnosis not present

## 2019-10-29 DIAGNOSIS — M25512 Pain in left shoulder: Secondary | ICD-10-CM | POA: Diagnosis not present

## 2019-10-29 DIAGNOSIS — M25612 Stiffness of left shoulder, not elsewhere classified: Secondary | ICD-10-CM | POA: Diagnosis not present

## 2019-10-31 DIAGNOSIS — M6281 Muscle weakness (generalized): Secondary | ICD-10-CM | POA: Diagnosis not present

## 2019-10-31 DIAGNOSIS — M25512 Pain in left shoulder: Secondary | ICD-10-CM | POA: Diagnosis not present

## 2019-10-31 DIAGNOSIS — M25612 Stiffness of left shoulder, not elsewhere classified: Secondary | ICD-10-CM | POA: Diagnosis not present

## 2019-11-04 DIAGNOSIS — M6281 Muscle weakness (generalized): Secondary | ICD-10-CM | POA: Diagnosis not present

## 2019-11-04 DIAGNOSIS — M25512 Pain in left shoulder: Secondary | ICD-10-CM | POA: Diagnosis not present

## 2019-11-04 DIAGNOSIS — M25612 Stiffness of left shoulder, not elsewhere classified: Secondary | ICD-10-CM | POA: Diagnosis not present

## 2019-11-07 DIAGNOSIS — M25612 Stiffness of left shoulder, not elsewhere classified: Secondary | ICD-10-CM | POA: Diagnosis not present

## 2019-11-07 DIAGNOSIS — M25512 Pain in left shoulder: Secondary | ICD-10-CM | POA: Diagnosis not present

## 2019-11-07 DIAGNOSIS — M6281 Muscle weakness (generalized): Secondary | ICD-10-CM | POA: Diagnosis not present

## 2019-11-11 DIAGNOSIS — M25612 Stiffness of left shoulder, not elsewhere classified: Secondary | ICD-10-CM | POA: Diagnosis not present

## 2019-11-11 DIAGNOSIS — M6281 Muscle weakness (generalized): Secondary | ICD-10-CM | POA: Diagnosis not present

## 2019-11-11 DIAGNOSIS — M25512 Pain in left shoulder: Secondary | ICD-10-CM | POA: Diagnosis not present

## 2019-11-13 DIAGNOSIS — M25612 Stiffness of left shoulder, not elsewhere classified: Secondary | ICD-10-CM | POA: Diagnosis not present

## 2019-11-13 DIAGNOSIS — M25512 Pain in left shoulder: Secondary | ICD-10-CM | POA: Diagnosis not present

## 2019-11-13 DIAGNOSIS — M6281 Muscle weakness (generalized): Secondary | ICD-10-CM | POA: Diagnosis not present

## 2019-11-18 DIAGNOSIS — M6281 Muscle weakness (generalized): Secondary | ICD-10-CM | POA: Diagnosis not present

## 2019-11-18 DIAGNOSIS — M25512 Pain in left shoulder: Secondary | ICD-10-CM | POA: Diagnosis not present

## 2019-11-18 DIAGNOSIS — M25612 Stiffness of left shoulder, not elsewhere classified: Secondary | ICD-10-CM | POA: Diagnosis not present

## 2019-11-20 DIAGNOSIS — M25612 Stiffness of left shoulder, not elsewhere classified: Secondary | ICD-10-CM | POA: Diagnosis not present

## 2019-11-20 DIAGNOSIS — M6281 Muscle weakness (generalized): Secondary | ICD-10-CM | POA: Diagnosis not present

## 2019-11-20 DIAGNOSIS — M25512 Pain in left shoulder: Secondary | ICD-10-CM | POA: Diagnosis not present

## 2019-11-25 DIAGNOSIS — M6281 Muscle weakness (generalized): Secondary | ICD-10-CM | POA: Diagnosis not present

## 2019-11-25 DIAGNOSIS — M25512 Pain in left shoulder: Secondary | ICD-10-CM | POA: Diagnosis not present

## 2019-11-25 DIAGNOSIS — M25612 Stiffness of left shoulder, not elsewhere classified: Secondary | ICD-10-CM | POA: Diagnosis not present

## 2019-11-27 DIAGNOSIS — M6281 Muscle weakness (generalized): Secondary | ICD-10-CM | POA: Diagnosis not present

## 2019-11-27 DIAGNOSIS — M25512 Pain in left shoulder: Secondary | ICD-10-CM | POA: Diagnosis not present

## 2019-11-27 DIAGNOSIS — M25612 Stiffness of left shoulder, not elsewhere classified: Secondary | ICD-10-CM | POA: Diagnosis not present

## 2019-12-02 DIAGNOSIS — M6281 Muscle weakness (generalized): Secondary | ICD-10-CM | POA: Diagnosis not present

## 2019-12-02 DIAGNOSIS — M25612 Stiffness of left shoulder, not elsewhere classified: Secondary | ICD-10-CM | POA: Diagnosis not present

## 2019-12-02 DIAGNOSIS — M25512 Pain in left shoulder: Secondary | ICD-10-CM | POA: Diagnosis not present

## 2019-12-04 DIAGNOSIS — M6281 Muscle weakness (generalized): Secondary | ICD-10-CM | POA: Diagnosis not present

## 2019-12-04 DIAGNOSIS — M25612 Stiffness of left shoulder, not elsewhere classified: Secondary | ICD-10-CM | POA: Diagnosis not present

## 2019-12-04 DIAGNOSIS — M25512 Pain in left shoulder: Secondary | ICD-10-CM | POA: Diagnosis not present

## 2019-12-10 DIAGNOSIS — M25612 Stiffness of left shoulder, not elsewhere classified: Secondary | ICD-10-CM | POA: Diagnosis not present

## 2019-12-10 DIAGNOSIS — M6281 Muscle weakness (generalized): Secondary | ICD-10-CM | POA: Diagnosis not present

## 2019-12-10 DIAGNOSIS — M25512 Pain in left shoulder: Secondary | ICD-10-CM | POA: Diagnosis not present

## 2019-12-18 DIAGNOSIS — M25512 Pain in left shoulder: Secondary | ICD-10-CM | POA: Diagnosis not present

## 2019-12-18 DIAGNOSIS — M6281 Muscle weakness (generalized): Secondary | ICD-10-CM | POA: Diagnosis not present

## 2019-12-18 DIAGNOSIS — M25612 Stiffness of left shoulder, not elsewhere classified: Secondary | ICD-10-CM | POA: Diagnosis not present

## 2019-12-19 DIAGNOSIS — M25612 Stiffness of left shoulder, not elsewhere classified: Secondary | ICD-10-CM | POA: Diagnosis not present

## 2019-12-19 DIAGNOSIS — M6281 Muscle weakness (generalized): Secondary | ICD-10-CM | POA: Diagnosis not present

## 2019-12-19 DIAGNOSIS — M25512 Pain in left shoulder: Secondary | ICD-10-CM | POA: Diagnosis not present

## 2019-12-23 DIAGNOSIS — M6281 Muscle weakness (generalized): Secondary | ICD-10-CM | POA: Diagnosis not present

## 2019-12-23 DIAGNOSIS — M25612 Stiffness of left shoulder, not elsewhere classified: Secondary | ICD-10-CM | POA: Diagnosis not present

## 2019-12-23 DIAGNOSIS — M25512 Pain in left shoulder: Secondary | ICD-10-CM | POA: Diagnosis not present

## 2019-12-25 DIAGNOSIS — M25512 Pain in left shoulder: Secondary | ICD-10-CM | POA: Diagnosis not present

## 2019-12-25 DIAGNOSIS — M25612 Stiffness of left shoulder, not elsewhere classified: Secondary | ICD-10-CM | POA: Diagnosis not present

## 2019-12-25 DIAGNOSIS — M6281 Muscle weakness (generalized): Secondary | ICD-10-CM | POA: Diagnosis not present

## 2019-12-26 DIAGNOSIS — Z713 Dietary counseling and surveillance: Secondary | ICD-10-CM | POA: Diagnosis not present

## 2019-12-30 DIAGNOSIS — M25612 Stiffness of left shoulder, not elsewhere classified: Secondary | ICD-10-CM | POA: Diagnosis not present

## 2019-12-30 DIAGNOSIS — M25512 Pain in left shoulder: Secondary | ICD-10-CM | POA: Diagnosis not present

## 2019-12-30 DIAGNOSIS — M6281 Muscle weakness (generalized): Secondary | ICD-10-CM | POA: Diagnosis not present

## 2020-01-06 DIAGNOSIS — M25512 Pain in left shoulder: Secondary | ICD-10-CM | POA: Diagnosis not present

## 2020-01-06 DIAGNOSIS — M25612 Stiffness of left shoulder, not elsewhere classified: Secondary | ICD-10-CM | POA: Diagnosis not present

## 2020-01-06 DIAGNOSIS — M6281 Muscle weakness (generalized): Secondary | ICD-10-CM | POA: Diagnosis not present

## 2020-01-29 DIAGNOSIS — Z9889 Other specified postprocedural states: Secondary | ICD-10-CM | POA: Diagnosis not present

## 2020-01-30 DIAGNOSIS — Z713 Dietary counseling and surveillance: Secondary | ICD-10-CM | POA: Diagnosis not present

## 2020-03-05 DIAGNOSIS — Z713 Dietary counseling and surveillance: Secondary | ICD-10-CM | POA: Diagnosis not present

## 2020-04-14 DIAGNOSIS — Z713 Dietary counseling and surveillance: Secondary | ICD-10-CM | POA: Diagnosis not present

## 2020-05-04 DIAGNOSIS — Z124 Encounter for screening for malignant neoplasm of cervix: Secondary | ICD-10-CM | POA: Diagnosis not present

## 2020-05-04 DIAGNOSIS — Z23 Encounter for immunization: Secondary | ICD-10-CM | POA: Diagnosis not present

## 2020-05-04 DIAGNOSIS — L409 Psoriasis, unspecified: Secondary | ICD-10-CM | POA: Diagnosis not present

## 2020-05-04 DIAGNOSIS — Z13220 Encounter for screening for lipoid disorders: Secondary | ICD-10-CM | POA: Diagnosis not present

## 2020-05-04 DIAGNOSIS — Z0000 Encounter for general adult medical examination without abnormal findings: Secondary | ICD-10-CM | POA: Diagnosis not present

## 2020-05-13 DIAGNOSIS — Z713 Dietary counseling and surveillance: Secondary | ICD-10-CM | POA: Diagnosis not present

## 2020-05-25 DIAGNOSIS — B373 Candidiasis of vulva and vagina: Secondary | ICD-10-CM | POA: Diagnosis not present

## 2020-05-25 DIAGNOSIS — N9089 Other specified noninflammatory disorders of vulva and perineum: Secondary | ICD-10-CM | POA: Diagnosis not present

## 2020-05-25 DIAGNOSIS — N898 Other specified noninflammatory disorders of vagina: Secondary | ICD-10-CM | POA: Diagnosis not present

## 2020-05-25 DIAGNOSIS — N899 Noninflammatory disorder of vagina, unspecified: Secondary | ICD-10-CM | POA: Diagnosis not present

## 2020-06-02 DIAGNOSIS — L309 Dermatitis, unspecified: Secondary | ICD-10-CM | POA: Diagnosis not present

## 2020-06-02 DIAGNOSIS — L270 Generalized skin eruption due to drugs and medicaments taken internally: Secondary | ICD-10-CM | POA: Diagnosis not present

## 2020-06-19 DIAGNOSIS — N760 Acute vaginitis: Secondary | ICD-10-CM | POA: Diagnosis not present

## 2020-06-19 DIAGNOSIS — N898 Other specified noninflammatory disorders of vagina: Secondary | ICD-10-CM | POA: Diagnosis not present

## 2020-06-19 DIAGNOSIS — N7689 Other specified inflammation of vagina and vulva: Secondary | ICD-10-CM | POA: Diagnosis not present

## 2020-07-01 DIAGNOSIS — Z713 Dietary counseling and surveillance: Secondary | ICD-10-CM | POA: Diagnosis not present

## 2020-07-20 ENCOUNTER — Ambulatory Visit: Payer: BC Managed Care – PPO | Admitting: Sports Medicine

## 2020-07-27 ENCOUNTER — Other Ambulatory Visit: Payer: Self-pay

## 2020-07-27 ENCOUNTER — Ambulatory Visit: Payer: Self-pay

## 2020-07-27 ENCOUNTER — Ambulatory Visit (INDEPENDENT_AMBULATORY_CARE_PROVIDER_SITE_OTHER): Payer: BC Managed Care – PPO | Admitting: Sports Medicine

## 2020-07-27 ENCOUNTER — Encounter: Payer: Self-pay | Admitting: Sports Medicine

## 2020-07-27 DIAGNOSIS — M25552 Pain in left hip: Secondary | ICD-10-CM

## 2020-07-27 NOTE — Assessment & Plan Note (Signed)
This is a very pleasant 44 year old female, she has a history of a right hip labral tear, injected at the hip center about 11 years ago and doing well. She is starting to have pain in her left hip, groin, with locking and catching. She feels as though her pain is very similar to prior. X-rays done with Dr. Denyse Amass show mild joint space narrowing on the left compared to the right. Rehab exercises given. Conservative treatment have not been efficacious so today we injected her hip joint, return to see me in 1 month to reevaluate.

## 2020-07-27 NOTE — Progress Notes (Signed)
    Procedures performed today:    None.  Independent interpretation of notes and tests performed by another provider:   None.  Brief History, Exam, Impression, and Recommendations:      ___________________________________________ Ihor Austin. Benjamin Stain, M.D., ABFM., CAQSM. Primary Care and Sports Medicine [facility]  Adjunct Instructor of Family Medicine  [employer]

## 2020-07-27 NOTE — Progress Notes (Signed)
    Procedures performed today:    Procedure: Real-time Ultrasound Guided injection of the left hip joint Device: Samsung HS60  Verbal informed consent obtained.  Time-out conducted.  Noted no overlying erythema, induration, or other signs of local infection.  Skin prepped in a sterile fashion.  Local anesthesia: Topical Ethyl chloride.  With sterile technique and under real time ultrasound guidance: 1 cc Kenalog 40, 2 cc lidocaine, 2 cc bupivacaine injected easily Completed without difficulty  Pain immediately resolved suggesting accurate placement of the medication.  Advised to call if fevers/chills, erythema, induration, drainage, or persistent bleeding.  Impression: Technically successful ultrasound guided injection.  Independent interpretation of notes and tests performed by another provider:   None.  Brief History, Exam, Impression, and Recommendations:    Left hip pain This is a very pleasant 44 year old female, she has a history of a right hip labral tear, injected at the hip center about 11 years ago and doing well. She is starting to have pain in her left hip, groin, with locking and catching. She feels as though her pain is very similar to prior. X-rays done with Dr. Denyse Amass show mild joint space narrowing on the left compared to the right. Rehab exercises given. Conservative treatment have not been efficacious so today we injected her hip joint, return to see me in 1 month to reevaluate.    ___________________________________________ Ihor Austin. Benjamin Stain, M.D., ABFM., CAQSM. Primary Care and Sports Medicine [place] [facility]  Adjunct Instructor of Family Medicine  [employer]

## 2020-07-28 ENCOUNTER — Ambulatory Visit: Payer: BC Managed Care – PPO | Admitting: Sports Medicine

## 2020-07-30 DIAGNOSIS — L270 Generalized skin eruption due to drugs and medicaments taken internally: Secondary | ICD-10-CM | POA: Diagnosis not present

## 2020-08-24 ENCOUNTER — Telehealth: Payer: BC Managed Care – PPO | Admitting: Sports Medicine

## 2021-01-12 DIAGNOSIS — L710 Perioral dermatitis: Secondary | ICD-10-CM | POA: Diagnosis not present

## 2021-01-12 DIAGNOSIS — L404 Guttate psoriasis: Secondary | ICD-10-CM | POA: Diagnosis not present

## 2021-01-12 DIAGNOSIS — L400 Psoriasis vulgaris: Secondary | ICD-10-CM | POA: Diagnosis not present

## 2021-07-28 DIAGNOSIS — Z01419 Encounter for gynecological examination (general) (routine) without abnormal findings: Secondary | ICD-10-CM | POA: Diagnosis not present

## 2021-07-28 DIAGNOSIS — Z6821 Body mass index (BMI) 21.0-21.9, adult: Secondary | ICD-10-CM | POA: Diagnosis not present

## 2021-08-26 DIAGNOSIS — N9419 Other specified dyspareunia: Secondary | ICD-10-CM | POA: Diagnosis not present

## 2021-08-30 DIAGNOSIS — Z1231 Encounter for screening mammogram for malignant neoplasm of breast: Secondary | ICD-10-CM | POA: Diagnosis not present

## 2021-09-09 DIAGNOSIS — L404 Guttate psoriasis: Secondary | ICD-10-CM | POA: Diagnosis not present

## 2021-09-09 DIAGNOSIS — L720 Epidermal cyst: Secondary | ICD-10-CM | POA: Diagnosis not present

## 2021-09-09 DIAGNOSIS — L400 Psoriasis vulgaris: Secondary | ICD-10-CM | POA: Diagnosis not present

## 2021-12-14 DIAGNOSIS — L400 Psoriasis vulgaris: Secondary | ICD-10-CM | POA: Diagnosis not present

## 2021-12-14 DIAGNOSIS — L718 Other rosacea: Secondary | ICD-10-CM | POA: Diagnosis not present

## 2021-12-14 DIAGNOSIS — L404 Guttate psoriasis: Secondary | ICD-10-CM | POA: Diagnosis not present

## 2022-09-20 DIAGNOSIS — L400 Psoriasis vulgaris: Secondary | ICD-10-CM | POA: Diagnosis not present

## 2022-09-20 DIAGNOSIS — L404 Guttate psoriasis: Secondary | ICD-10-CM | POA: Diagnosis not present

## 2022-09-20 DIAGNOSIS — L718 Other rosacea: Secondary | ICD-10-CM | POA: Diagnosis not present

## 2022-09-23 DIAGNOSIS — Z01419 Encounter for gynecological examination (general) (routine) without abnormal findings: Secondary | ICD-10-CM | POA: Diagnosis not present

## 2022-09-23 DIAGNOSIS — Z6823 Body mass index (BMI) 23.0-23.9, adult: Secondary | ICD-10-CM | POA: Diagnosis not present

## 2022-09-30 DIAGNOSIS — Z1231 Encounter for screening mammogram for malignant neoplasm of breast: Secondary | ICD-10-CM | POA: Diagnosis not present

## 2022-09-30 DIAGNOSIS — R92333 Mammographic heterogeneous density, bilateral breasts: Secondary | ICD-10-CM | POA: Diagnosis not present

## 2022-12-30 ENCOUNTER — Ambulatory Visit (INDEPENDENT_AMBULATORY_CARE_PROVIDER_SITE_OTHER): Payer: BC Managed Care – PPO

## 2022-12-30 ENCOUNTER — Ambulatory Visit: Payer: BC Managed Care – PPO | Admitting: Sports Medicine

## 2022-12-30 DIAGNOSIS — M79672 Pain in left foot: Secondary | ICD-10-CM | POA: Diagnosis not present

## 2022-12-30 DIAGNOSIS — M25475 Effusion, left foot: Secondary | ICD-10-CM | POA: Diagnosis not present

## 2022-12-30 NOTE — Assessment & Plan Note (Signed)
Very pleasant 47 year old female, she has some baseline left first MTP pain, suspect osteoarthritis, more recently she did some lunges and had a dramatic increase in her first MTP pain dorsally. On exam she does have some swelling left first MTP, no hallux limitus or rigidus, no hallux valgus. She does have tenderness to palpation at the joint. We will start conservatively with meloxicam, x-rays, a Morton's plate. She will return to see me in about 4 weeks, we can discuss additional treatments including MRI versus injection if not better.

## 2022-12-30 NOTE — Progress Notes (Signed)
    Procedures performed today:    None.  Independent interpretation of notes and tests performed by another provider:   None.  Brief History, Exam, Impression, and Recommendations:    Swelling of first metatarsophalangeal (MTP) joint of left foot Very pleasant 47 year old female, she has some baseline left first MTP pain, suspect osteoarthritis, more recently she did some lunges and had a dramatic increase in her first MTP pain dorsally. On exam she does have some swelling left first MTP, no hallux limitus or rigidus, no hallux valgus. She does have tenderness to palpation at the joint. We will start conservatively with meloxicam, x-rays, a Morton's plate. She will return to see me in about 4 weeks, we can discuss additional treatments including MRI versus injection if not better.    ____________________________________________ Gwen Her. Dianah Field, M.D., ABFM., CAQSM., AME. Primary Care and Sports Medicine [facility]  Adjunct Professor of [place] [employer] [facility] [employer] Medicine  Risk manager

## 2023-01-27 ENCOUNTER — Ambulatory Visit (INDEPENDENT_AMBULATORY_CARE_PROVIDER_SITE_OTHER): Payer: BC Managed Care – PPO | Admitting: Sports Medicine

## 2023-01-27 DIAGNOSIS — M25475 Effusion, left foot: Secondary | ICD-10-CM | POA: Diagnosis not present

## 2023-01-27 NOTE — Assessment & Plan Note (Signed)
Very pleasant 47 year old female, did have some baseline left first MTP pain. Better with meloxicam, Morton's plate. She is doing well overall, understands this is somewhat lifelong, I will refill her meloxicam, she will continue to avoid lunges, she can wear the Morton's plate in her shoes when able but not when running. Return to see me as needed

## 2023-01-27 NOTE — Progress Notes (Signed)
    Procedures performed today:    None.  Independent interpretation of notes and tests performed by another provider:   None.  Brief History, Exam, Impression, and Recommendations:    Swelling of first metatarsophalangeal (MTP) joint of left foot Very pleasant 47 year old female, did have some baseline left first MTP pain. Better with meloxicam, Morton's plate. She is doing well overall, understands this is somewhat lifelong, I will refill her meloxicam, she will continue to avoid lunges, she can wear the Morton's plate in her shoes when able but not when running. Return to see me as needed    ____________________________________________ Ihor Austin. Benjamin Stain, M.D., ABFM., CAQSM., AME. Primary Care and Sports Medicine [facility]  Adjunct Professor of Family Medicine  [place] [employer] [facility] [employer]  Restaurant manager, fast food

## 2023-05-10 ENCOUNTER — Ambulatory Visit: Payer: BC Managed Care – PPO | Admitting: Family Medicine

## 2023-07-03 DIAGNOSIS — R0781 Pleurodynia: Secondary | ICD-10-CM | POA: Diagnosis not present

## 2023-07-06 DIAGNOSIS — R0789 Other chest pain: Secondary | ICD-10-CM | POA: Diagnosis not present

## 2023-07-06 DIAGNOSIS — Z1330 Encounter for screening examination for mental health and behavioral disorders, unspecified: Secondary | ICD-10-CM | POA: Diagnosis not present

## 2023-07-06 DIAGNOSIS — G479 Sleep disorder, unspecified: Secondary | ICD-10-CM | POA: Diagnosis not present

## 2023-07-06 DIAGNOSIS — R5383 Other fatigue: Secondary | ICD-10-CM | POA: Diagnosis not present

## 2023-07-06 DIAGNOSIS — R079 Chest pain, unspecified: Secondary | ICD-10-CM | POA: Diagnosis not present

## 2023-07-06 DIAGNOSIS — L659 Nonscarring hair loss, unspecified: Secondary | ICD-10-CM | POA: Diagnosis not present

## 2023-08-03 DIAGNOSIS — F419 Anxiety disorder, unspecified: Secondary | ICD-10-CM | POA: Diagnosis not present

## 2023-08-03 DIAGNOSIS — Z0000 Encounter for general adult medical examination without abnormal findings: Secondary | ICD-10-CM | POA: Diagnosis not present

## 2023-08-03 DIAGNOSIS — F439 Reaction to severe stress, unspecified: Secondary | ICD-10-CM | POA: Diagnosis not present

## 2023-08-03 DIAGNOSIS — Z1211 Encounter for screening for malignant neoplasm of colon: Secondary | ICD-10-CM | POA: Diagnosis not present

## 2023-09-06 DIAGNOSIS — L700 Acne vulgaris: Secondary | ICD-10-CM | POA: Diagnosis not present

## 2023-09-06 DIAGNOSIS — L404 Guttate psoriasis: Secondary | ICD-10-CM | POA: Diagnosis not present

## 2023-09-06 DIAGNOSIS — L718 Other rosacea: Secondary | ICD-10-CM | POA: Diagnosis not present

## 2023-10-03 DIAGNOSIS — Z6824 Body mass index (BMI) 24.0-24.9, adult: Secondary | ICD-10-CM | POA: Diagnosis not present

## 2023-10-03 DIAGNOSIS — Z124 Encounter for screening for malignant neoplasm of cervix: Secondary | ICD-10-CM | POA: Diagnosis not present

## 2023-10-03 DIAGNOSIS — Z01419 Encounter for gynecological examination (general) (routine) without abnormal findings: Secondary | ICD-10-CM | POA: Diagnosis not present

## 2023-10-23 ENCOUNTER — Ambulatory Visit (INDEPENDENT_AMBULATORY_CARE_PROVIDER_SITE_OTHER): Payer: BC Managed Care – PPO | Admitting: Sports Medicine

## 2023-10-23 ENCOUNTER — Ambulatory Visit: Payer: BC Managed Care – PPO

## 2023-10-23 DIAGNOSIS — M47812 Spondylosis without myelopathy or radiculopathy, cervical region: Secondary | ICD-10-CM | POA: Diagnosis not present

## 2023-10-23 DIAGNOSIS — M5412 Radiculopathy, cervical region: Secondary | ICD-10-CM | POA: Diagnosis not present

## 2023-10-23 DIAGNOSIS — M4802 Spinal stenosis, cervical region: Secondary | ICD-10-CM | POA: Diagnosis not present

## 2023-10-23 DIAGNOSIS — M542 Cervicalgia: Secondary | ICD-10-CM | POA: Diagnosis not present

## 2023-10-23 MED ORDER — PREDNISONE 50 MG PO TABS: ORAL_TABLET | ORAL | 0 refills | Status: AC

## 2023-10-23 NOTE — Progress Notes (Signed)
    Procedures performed today:    None.  Independent interpretation of notes and tests performed by another provider:   None.  Brief History, Exam, Impression, and Recommendations:    Left cervical radiculopathy Very pleasant 47 year old female, she is very active, she does a lot of weight lifting, starting to have increasing pain left side of the neck, trapezius with radiation to the fourth and fifth fingers on the left. On exam she has good motion, good strength, good reflexes but a positive Spurling sign on the left. We discussed the anatomy and pathophysiology, she will avoid spinal loading type exercises for now, or at least decrease the weight by half and double her repetitions. We will have her do some prednisone, home physical therapy and x-rays. Return to see me in 6 weeks, MR for interventional planning if not better.    ____________________________________________ Ihor Austin. Benjamin Stain, M.D., ABFM., CAQSM., AME. Primary Care and Sports Medicine [facility]  Adjunct Professor of Family Medicine  [place] [employer] [facility] [employer]  Restaurant manager, fast food

## 2023-10-23 NOTE — Assessment & Plan Note (Signed)
Very pleasant 47 year old female, she is very active, she does a lot of weight lifting, starting to have increasing pain left side of the neck, trapezius with radiation to the fourth and fifth fingers on the left. On exam she has good motion, good strength, good reflexes but a positive Spurling sign on the left. We discussed the anatomy and pathophysiology, she will avoid spinal loading type exercises for now, or at least decrease the weight by half and double her repetitions. We will have her do some prednisone, home physical therapy and x-rays. Return to see me in 6 weeks, MR for interventional planning if not better.

## 2023-10-24 ENCOUNTER — Encounter: Payer: Self-pay | Admitting: Sports Medicine

## 2023-11-01 ENCOUNTER — Telehealth: Payer: Self-pay

## 2023-11-01 NOTE — Telephone Encounter (Signed)
 Images have been completed.

## 2023-11-01 NOTE — Telephone Encounter (Signed)
 Copied from CRM [id number]. Topic: Clinical - Lab/Test Results >> Oct 31, 2023 12:15 PM Robin Patrick wrote: Reason for CRM: Patient calling to see if we can expedite results for X-Ray done on 12/30.

## 2023-12-01 DIAGNOSIS — Z1213 Encounter for screening for malignant neoplasm of small intestine: Secondary | ICD-10-CM | POA: Diagnosis not present

## 2023-12-01 DIAGNOSIS — Z1231 Encounter for screening mammogram for malignant neoplasm of breast: Secondary | ICD-10-CM | POA: Diagnosis not present

## 2023-12-01 DIAGNOSIS — R92323 Mammographic fibroglandular density, bilateral breasts: Secondary | ICD-10-CM | POA: Diagnosis not present

## 2023-12-04 ENCOUNTER — Ambulatory Visit (INDEPENDENT_AMBULATORY_CARE_PROVIDER_SITE_OTHER): Payer: BC Managed Care – PPO | Admitting: Sports Medicine

## 2023-12-04 DIAGNOSIS — M5412 Radiculopathy, cervical region: Secondary | ICD-10-CM | POA: Diagnosis not present

## 2023-12-04 NOTE — Assessment & Plan Note (Signed)
 Robin Patrick returns, she is an exquisitely pleasant 48 year old female, she is very active, does a lot of weight lifting, we last saw her at the end of December, she had some increasing pain left side of the neck, trapezius with radiation fourth and fifth angers on the left, exam was normal however she did have a positive Spurling sign on the left. We added some home PT, prednisone , she did really well initially, she had some recurrence of pain after the prednisone  finished, she did get a massage with one of her friends and symptoms improved dramatically. She is actually doing okay right now, I do not think we need the MRI at this juncture. She will do meloxicam as needed, I have suggested a half tab with food and hydration on the mornings that she works out. I have also suggested avoiding squats with the bar and doing more of a leg press type machine to decrease axial loading of her spine. She can do cable glute extensions as well. [name] are fine. She will do some formal therapy to work on traction and other modalities, and we can leave the follow-up with me as needed.

## 2023-12-04 NOTE — Progress Notes (Signed)
    Procedures performed today:    None.  Independent interpretation of notes and tests performed by another provider:   None.  Brief History, Exam, Impression, and Recommendations:    Left cervical radiculopathy Robin Patrick returns, she is an exquisitely pleasant 48 year old female, she is very active, does a lot of weight lifting, we last saw her at the end of December, she had some increasing pain left side of the neck, trapezius with radiation fourth and fifth angers on the left, exam was normal however she did have a positive Spurling sign on the left. We added some home PT, prednisone , she did really well initially, she had some recurrence of pain after the prednisone  finished, she did get a massage with one of her friends and symptoms improved dramatically. She is actually doing okay right now, I do not think we need the MRI at this juncture. She will do meloxicam as needed, I have suggested a half tab with food and hydration on the mornings that she works out. I have also suggested avoiding squats with the bar and doing more of a leg press type machine to decrease axial loading of her spine. She can do cable glute extensions as well. [name] are fine. She will do some formal therapy to work on traction and other modalities, and we can leave the follow-up with me as needed.    ____________________________________________ Joselyn Nicely. Sandy Crumb, M.D., ABFM., CAQSM., AME. Primary Care and Sports Medicine [place] [facility]  Adjunct Professor of [facility] Medicine  [employer]  Restaurant manager, fast food

## 2023-12-21 DIAGNOSIS — D235 Other benign neoplasm of skin of trunk: Secondary | ICD-10-CM | POA: Diagnosis not present

## 2023-12-21 DIAGNOSIS — D485 Neoplasm of uncertain behavior of skin: Secondary | ICD-10-CM | POA: Diagnosis not present

## 2023-12-21 DIAGNOSIS — L820 Inflamed seborrheic keratosis: Secondary | ICD-10-CM | POA: Diagnosis not present

## 2024-03-12 DIAGNOSIS — J3489 Other specified disorders of nose and nasal sinuses: Secondary | ICD-10-CM | POA: Diagnosis not present

## 2024-03-12 DIAGNOSIS — F419 Anxiety disorder, unspecified: Secondary | ICD-10-CM | POA: Diagnosis not present

## 2024-03-12 DIAGNOSIS — R42 Dizziness and giddiness: Secondary | ICD-10-CM | POA: Diagnosis not present

## 2024-03-12 DIAGNOSIS — H6593 Unspecified nonsuppurative otitis media, bilateral: Secondary | ICD-10-CM | POA: Diagnosis not present

## 2024-06-25 ENCOUNTER — Encounter: Payer: Self-pay | Admitting: Sports Medicine

## 2024-09-05 DIAGNOSIS — L820 Inflamed seborrheic keratosis: Secondary | ICD-10-CM | POA: Diagnosis not present

## 2024-09-05 DIAGNOSIS — L249 Irritant contact dermatitis, unspecified cause: Secondary | ICD-10-CM | POA: Diagnosis not present

## 2024-09-05 DIAGNOSIS — L718 Other rosacea: Secondary | ICD-10-CM | POA: Diagnosis not present

## 2024-09-05 DIAGNOSIS — L404 Guttate psoriasis: Secondary | ICD-10-CM | POA: Diagnosis not present

## 2024-09-05 DIAGNOSIS — L570 Actinic keratosis: Secondary | ICD-10-CM | POA: Diagnosis not present

## 2024-09-05 DIAGNOSIS — L821 Other seborrheic keratosis: Secondary | ICD-10-CM | POA: Diagnosis not present
